# Patient Record
Sex: Male | Born: 1950 | Race: White | Hispanic: No | Marital: Married | Smoking: Former smoker
Health system: Southern US, Community
[De-identification: ages and names within clinical notes are randomized; demographics above are authoritative.]

## PROBLEM LIST (undated history)

## (undated) DIAGNOSIS — M1990 Unspecified osteoarthritis, unspecified site: Secondary | ICD-10-CM

## (undated) DIAGNOSIS — G4730 Sleep apnea, unspecified: Secondary | ICD-10-CM

## (undated) HISTORY — PX: HERNIA REPAIR: SHX51

## (undated) HISTORY — PX: FRACTURE SURGERY: SHX138

---

## 2017-01-31 ENCOUNTER — Encounter (INDEPENDENT_AMBULATORY_CARE_PROVIDER_SITE_OTHER): Payer: Self-pay | Admitting: Orthopaedic Surgery

## 2017-01-31 ENCOUNTER — Ambulatory Visit (INDEPENDENT_AMBULATORY_CARE_PROVIDER_SITE_OTHER): Payer: Medicare Other

## 2017-01-31 ENCOUNTER — Ambulatory Visit (INDEPENDENT_AMBULATORY_CARE_PROVIDER_SITE_OTHER): Payer: Medicare Other | Admitting: Orthopaedic Surgery

## 2017-01-31 DIAGNOSIS — M1612 Unilateral primary osteoarthritis, left hip: Secondary | ICD-10-CM | POA: Insufficient documentation

## 2017-01-31 DIAGNOSIS — M25552 Pain in left hip: Secondary | ICD-10-CM | POA: Diagnosis not present

## 2017-01-31 NOTE — Progress Notes (Signed)
Office Visit Note   Patient: Jay Watson           Date of Birth: 10/05/1950           MRN: [id number] Visit Date: 01/31/2017              Requested by: No referring provider defined for this encounter. PCP: Javier GlazierFarrington, Cecil, MD   Assessment & Plan: Visit Diagnoses:  1. Pain in left hip   2. Unilateral primary osteoarthritis, left hip     Plan: Given the severity of his pain and the detrimental effect of the left hip is had on his quality of life, his mobility and day-to-day activities we are recommending a hip replacement on the left hip.  His x-rays show profound loss of the joint space with collapse of the femoral head at this point.  His ligaments are also shorter on his left painful side than the right side.  I spent a considerable amount of time shown on hip model explained in detail with the surgery involves a better recommendation.  He is been hurting since 2014 now and says that it is time to proceed with this.  He is otherwise healthy individual.  We talked about the risk and benefits of surgery as well as his intraoperative and postoperative course.  We will work on getting the surgery scheduled soon.  We can see him back in 2 weeks postoperative.  All questions and concerns were answered and addressed.  Follow-Up Instructions: Return for 2 weeks post-op.   Orders:  Orders Placed This Encounter  Procedures  . XR HIP UNILAT W OR W/O PELVIS 1V LEFT   No orders of the defined types were placed in this encounter.     Procedures: No procedures performed   Clinical Data: No additional findings.   Subjective: Chief Complaint  Patient presents with  . Left Hip - Pain  Patient is a very pleasant 66 year old gentleman was referred from other friends who have done surgery on before.  He had a left hip is hurting him for over 5 years now.  In 2013 2014 started to slow down because he can play as much activity.  His pain is daily and is 10 out of 10.  Is waking up at  night and walking is become painful.  He says his left leg is now definite short and the right and the right has a history of trauma to it.  His pain is in the groin on the left side.  He is tried and failed all forms of conservative treatment including rest, ice, heat, anti-inflammatories, quad strengthening exercises, nutrition and even a walking assistive device.  Again he sought all this treatment out for well over 5 years now.  HPI  Review of Systems He currently denies any headache, chest pain, shortness of breath, fever, chills, nausea, vomiting.  Objective: Vital Signs: There were no vitals taken for this visit.  Physical Exam Is alert and oriented x3 and in no acute distress Ortho Exam Examination of his left hip shows significant limitations with internal and external rotation severe pain in the groin with attempts at rotation.  He is actually shorter on the left side than the right as well. Specialty Comments:  No specialty comments available.  Imaging: Xr Hip Unilat W Or W/o Pelvis 1v Left  Result Date: 01/31/2017 An AP pelvis and a lateral of the left hip show severe end-stage arthritis of the left hip.  There is complete loss of superolateral  joint space.  There is slight femoral head collapse.  There is severe sclerotic changes and cystic changes.    PMFS History: Patient Active Problem List   Diagnosis Date Noted  . Unilateral primary osteoarthritis, left hip 01/31/2017  . Pain in left hip 01/31/2017   History reviewed. No pertinent past medical history.  History reviewed. No pertinent family history.  History reviewed. No pertinent surgical history. Social History   Occupational History  . Not on file  Tobacco Use  . Smoking status: Not on file  Substance and Sexual Activity  . Alcohol use: Not on file  . Drug use: Not on file  . Sexual activity: Not on file

## 2017-02-28 ENCOUNTER — Other Ambulatory Visit (INDEPENDENT_AMBULATORY_CARE_PROVIDER_SITE_OTHER): Payer: Self-pay | Admitting: Physician Assistant

## 2017-02-28 NOTE — Pre-Procedure Instructions (Signed)
Jay Watson  02/28/2017      [id number] - [place], [place] - [address] [place] [address] Phone: [phone] Fax: [phone]    Your procedure is scheduled on December 18th, Tuesday   [facility] Ad[facility] at 10:30 AM             (posted surgery time 12:30p - 2:15p)   Call this number if you have problems the [facility]:  [phone].   Remember:               4-5 days prior to surgery, STOP TAKING any Vitamins, Herbal Supplements, Anti-inflammatories.   Do not eat food or drink liquids after midnight Monday.   Take these medicines the morning of surgery with A SIP OF WATER : NONE   Do not wear jewelry - no rings or watches.  Do not wear lotions, colognes or deoderant.             Men may shave face and neck.   Do not bring valuables to the hospital.  [facility] is not responsible for any belongings or valuables.  Contacts, dentures or bridgework may not be worn into surgery.  Leave your suitcase in the car.  After surgery it may be brought to your room.  For patients admitted to the hospital, discharge time will be determined by your treatment team.  Please read over the following fact sheets that you were given. Pain Booklet, MRSA Information and Surgical Site Infection Prevention      [place]- Preparing For Surgery  Before surgery, you can play an important role. Because skin is not sterile, your skin needs to be as free of germs as possible. You can reduce the number of germs on your skin by washing with CHG (chlorahexidine gluconate) Soap before surgery.  CHG is an antiseptic cleaner which kills germs and bonds with the skin to continue killing germs even after washing.  Please do not use if you have an allergy to CHG or antibacterial soaps. If your skin becomes reddened/irritated stop using the CHG.  Do not shave (including legs and underarms) for at least 48 hours prior to first CHG shower.  It is OK to shave your face.  Please follow these instructions carefully.   1. Shower the NIGHT BEFORE SURGERY and the MORNING OF SURGERY with CHG.   2. If you chose to wash your hair, wash your hair first as usual with your normal shampoo.  3. After you shampoo, rinse your hair and body thoroughly to remove the shampoo.  4. Use CHG as you would any other liquid soap. You can apply CHG directly to the skin and wash gently with a scrungie or a clean washcloth.   5. Apply the CHG Soap to your body ONLY FROM THE NECK DOWN.  Do not use on open wounds or open sores. Avoid contact with your eyes, ears, mouth and genitals (private parts). Wash Face and genitals (private parts)  with your normal soap.  6. Wash thoroughly, paying special attention to the area where your surgery will be performed.  7. Thoroughly rinse your body with warm water from the neck down.  8. DO NOT shower/wash with your normal soap after using and rinsing off the CHG Soap.  9. Pat yourself dry with a CLEAN TOWEL.  10. Wear CLEAN PAJAMAS to bed the night before surgery, wear comfortable clothes the morning of surgery  11. Place  CLEAN SHEETS on your bed the night of your first shower and DO NOT SLEEP WITH PETS.    Day of Surgery: Do not apply any deodorants/lotions. Please wear clean clothes to the hospital/surgery center.

## 2017-02-28 NOTE — Pre-Procedure Instructions (Signed)
Jay Watson  02/28/2017      [id number] - [place], [place] - [address] [place] [address] Phone: [phone] Fax: [phone]    Your procedure is scheduled on Tuesday 03/13/2017.  [facility] at 1030 A.M.  Call this number if you have problems the morning of surgery:  [phone]   Remember:  Do not eat food or drink liquids after midnight.  Take these medicines the morning of surgery with A SIP OF WATER: NONE  7 days prior to surgery STOP taking any Aspirin(unless otherwise instructed by your surgeon), Aleve, Naproxen, Ibuprofen, Motrin, Advil, Goody's, BC's, all herbal medications, fish oil, and all vitamins   Do not wear jewelry.  Do not wear lotions, powders, or colognes, or deodorant.  Men may shave face and neck.  Do not bring valuables to the hospital.  [facility] is not responsible for any belongings or valuables.  Contacts, eyeglasses, dentures or bridgework may not be worn into surgery.  Leave your suitcase in the car.  After surgery it may be brought to your room.  For patients admitted to the hospital, discharge time will be determined by your treatment team.  Patients discharged the day of surgery will not be allowed to drive home.   Name and phone number of your driver:    Special instructions:   [place]- Preparing For Surgery  Before surgery, you can play an important role. Because skin is not sterile, your skin needs to be as free of germs as possible. You can reduce the number of germs on your skin by washing with CHG (chlorahexidine gluconate) Soap before surgery.  CHG is an antiseptic cleaner which kills germs and bonds with the skin to continue killing germs even after washing.  Please do not use if you have an allergy to CHG or antibacterial soaps. If your skin becomes reddened/irritated stop using the CHG.  Do not shave (including legs and underarms) for at least 48 hours prior to  first CHG shower. It is OK to shave your face.  Please follow these instructions carefully.   1. Shower the NIGHT BEFORE SURGERY and the MORNING OF SURGERY with CHG.   2. If you chose to wash your hair, wash your hair first as usual with your normal shampoo.  3. After you shampoo, rinse your hair and body thoroughly to remove the shampoo.  4. Use CHG as you would any other liquid soap. You can apply CHG directly to the skin and wash gently with a scrungie or a clean washcloth.   5. Apply the CHG Soap to your body ONLY FROM THE NECK DOWN.  Do not use on open wounds or open sores. Avoid contact with your eyes, ears, mouth and genitals (private parts). Wash Face and genitals (private parts)  with your normal soap.  6. Wash thoroughly, paying special attention to the area where your surgery will be performed.  7. Thoroughly rinse your body with warm water from the neck down.  8. DO NOT shower/wash with your normal soap after using and rinsing off the CHG Soap.  9. Pat yourself dry with a CLEAN TOWEL.  10. Wear CLEAN PAJAMAS to bed the night before surgery, wear comfortable clothes the morning of surgery  11. Place CLEAN SHEETS on your bed the night of your first shower and DO NOT SLEEP WITH PETS.    Day of Surgery: Shower as stated above. Do not apply any  deodorants/lotions.  Please wear clean clothes to the hospital/surgery center.      Please read over the following fact sheets that you were given.

## 2017-03-01 ENCOUNTER — Other Ambulatory Visit: Payer: Self-pay

## 2017-03-01 ENCOUNTER — Encounter (HOSPITAL_COMMUNITY)
Admission: RE | Admit: 2017-03-01 | Discharge: 2017-03-01 | Disposition: A | Discharge status: Home / Self Care | Payer: Medicare Other | Source: Ambulatory Visit | Attending: Orthopaedic Surgery | Admitting: Orthopaedic Surgery

## 2017-03-01 ENCOUNTER — Encounter (HOSPITAL_COMMUNITY): Payer: Self-pay

## 2017-03-01 DIAGNOSIS — Z01818 Encounter for other preprocedural examination: Secondary | ICD-10-CM | POA: Insufficient documentation

## 2017-03-01 DIAGNOSIS — M1612 Unilateral primary osteoarthritis, left hip: Secondary | ICD-10-CM | POA: Diagnosis not present

## 2017-03-01 HISTORY — DX: Unspecified osteoarthritis, unspecified site: M19.90

## 2017-03-01 HISTORY — DX: Sleep apnea, unspecified: G47.30

## 2017-03-01 LAB — BASIC METABOLIC PANEL
Anion gap: 9 (ref 5–15)
BUN: 20 mg/dL (ref 6–20)
CHLORIDE: 105 mmol/L (ref 101–111)
CO2: 24 mmol/L (ref 22–32)
CREATININE: 0.97 mg/dL (ref 0.61–1.24)
Calcium: 9.4 mg/dL (ref 8.9–10.3)
GFR calc Af Amer: >60 mL/min (ref >60)
GLUCOSE: 103 mg/dL — AB (ref 65–99)
POTASSIUM: 4.5 mmol/L (ref 3.5–5.1)
Sodium: 138 mmol/L (ref 135–145)

## 2017-03-01 LAB — CBC
HCT: 44.9 % (ref 39.0–52.0)
Hemoglobin: 15 g/dL (ref 13.0–17.0)
MCH: 29.4 pg (ref 26.0–34.0)
MCHC: 33.4 g/dL (ref 30.0–36.0)
MCV: 88 fL (ref 78.0–100.0)
PLATELETS: 217 10*3/uL (ref 150–400)
RBC: 5.1 MIL/uL (ref 4.22–5.81)
RDW: 12.6 % (ref 11.5–15.5)
WBC: 9 10*3/uL (ref 4.0–10.5)

## 2017-03-01 LAB — SURGICAL PCR SCREEN
MRSA, PCR: NEGATIVE
Staphylococcus aureus: NEGATIVE

## 2017-03-01 NOTE — Progress Notes (Signed)
PCP [place] Dr. [place].  LOV 01/2016  He is aware of upcoming surgery. [place]  Wears the mask periodically.  Setting is 4. Denies murmur, cp, sob.   Denies ever having cardiac tests or seeing a cardiologist.

## 2017-03-12 MED ORDER — CLINDAMYCIN PHOSPHATE 900 MG/50ML IV SOLN
900.0000 mg | INTRAVENOUS | Status: AC
  Administered 2017-03-13: 900 mg via INTRAVENOUS
  Filled 2017-03-12: qty 50

## 2017-03-12 MED ORDER — SODIUM CHLORIDE 0.9 % IV SOLN
1000.0000 mg | INTRAVENOUS | Status: AC
  Administered 2017-03-13: 1000 mg via INTRAVENOUS
  Filled 2017-03-12: qty 1100

## 2017-03-13 ENCOUNTER — Inpatient Hospital Stay (HOSPITAL_COMMUNITY): Payer: Medicare Other

## 2017-03-13 ENCOUNTER — Encounter (HOSPITAL_COMMUNITY): Payer: Self-pay

## 2017-03-13 ENCOUNTER — Encounter (HOSPITAL_COMMUNITY)
Admission: RE | Disposition: A | Discharge status: Home / Self Care | Payer: Self-pay | Source: Ambulatory Visit | Attending: Orthopaedic Surgery

## 2017-03-13 ENCOUNTER — Inpatient Hospital Stay (HOSPITAL_COMMUNITY): Payer: Medicare Other | Admitting: Anesthesiology

## 2017-03-13 ENCOUNTER — Inpatient Hospital Stay (HOSPITAL_COMMUNITY)
Admission: RE | Admit: 2017-03-13 | Discharge: 2017-03-15 | DRG: 470 | Disposition: A | Discharge status: Home / Self Care | Payer: Medicare Other | Source: Ambulatory Visit | Attending: Orthopaedic Surgery | Admitting: Orthopaedic Surgery

## 2017-03-13 DIAGNOSIS — Z96642 Presence of left artificial hip joint: Secondary | ICD-10-CM

## 2017-03-13 DIAGNOSIS — Z880 Allergy status to penicillin: Secondary | ICD-10-CM

## 2017-03-13 DIAGNOSIS — G4730 Sleep apnea, unspecified: Secondary | ICD-10-CM | POA: Diagnosis present

## 2017-03-13 DIAGNOSIS — Z419 Encounter for procedure for purposes other than remedying health state, unspecified: Secondary | ICD-10-CM

## 2017-03-13 DIAGNOSIS — M1612 Unilateral primary osteoarthritis, left hip: Secondary | ICD-10-CM | POA: Diagnosis present

## 2017-03-13 HISTORY — PX: TOTAL HIP ARTHROPLASTY: SHX124

## 2017-03-13 SURGERY — ARTHROPLASTY, HIP, TOTAL, ANTERIOR APPROACH
Anesthesia: General | Laterality: Left

## 2017-03-13 MED ORDER — ACETAMINOPHEN 650 MG RE SUPP: 650.0000 mg | RECTAL | Status: DC | PRN

## 2017-03-13 MED ORDER — HYDROMORPHONE HCL 1 MG/ML IJ SOLN
1.0000 mg | INTRAMUSCULAR | Status: DC | PRN
  Administered 2017-03-14: 1 mg via INTRAVENOUS
  Filled 2017-03-13: qty 1

## 2017-03-13 MED ORDER — ACETAMINOPHEN 325 MG PO TABS: 650.0000 mg | ORAL_TABLET | ORAL | Status: DC | PRN

## 2017-03-13 MED ORDER — DEXAMETHASONE SODIUM PHOSPHATE 10 MG/ML IJ SOLN
INTRAMUSCULAR | Status: AC
  Filled 2017-03-13: qty 1

## 2017-03-13 MED ORDER — DOCUSATE SODIUM 100 MG PO CAPS
100.0000 mg | ORAL_CAPSULE | Freq: Two times a day (BID) | ORAL | Status: DC
  Administered 2017-03-13 – 2017-03-15 (×4): 100 mg via ORAL
  Filled 2017-03-13 (×4): qty 1

## 2017-03-13 MED ORDER — FENTANYL CITRATE (PF) 100 MCG/2ML IJ SOLN
INTRAMUSCULAR | Status: DC | PRN
  Administered 2017-03-13: 50 ug via INTRAVENOUS
  Administered 2017-03-13: 125 ug via INTRAVENOUS
  Administered 2017-03-13: 50 ug via INTRAVENOUS
  Administered 2017-03-13: 125 ug via INTRAVENOUS
  Administered 2017-03-13: 50 ug via INTRAVENOUS

## 2017-03-13 MED ORDER — METHOCARBAMOL 500 MG PO TABS
ORAL_TABLET | ORAL | Status: AC
  Administered 2017-03-13: 500 mg via ORAL
  Filled 2017-03-13: qty 1

## 2017-03-13 MED ORDER — DIPHENHYDRAMINE HCL 12.5 MG/5ML PO ELIX: 12.5000 mg | ORAL_SOLUTION | ORAL | Status: DC | PRN

## 2017-03-13 MED ORDER — MENTHOL 3 MG MT LOZG: 1.0000 | LOZENGE | OROMUCOSAL | Status: DC | PRN

## 2017-03-13 MED ORDER — ROCURONIUM BROMIDE 100 MG/10ML IV SOLN
INTRAVENOUS | Status: DC | PRN
  Administered 2017-03-13: 60 mg via INTRAVENOUS
  Administered 2017-03-13: 10 mg via INTRAVENOUS

## 2017-03-13 MED ORDER — PHENYLEPHRINE HCL 10 MG/ML IJ SOLN
INTRAVENOUS | Status: DC | PRN
  Administered 2017-03-13: 25 ug/min via INTRAVENOUS

## 2017-03-13 MED ORDER — SUGAMMADEX SODIUM 200 MG/2ML IV SOLN
INTRAVENOUS | Status: DC | PRN
  Administered 2017-03-13: 200 mg via INTRAVENOUS

## 2017-03-13 MED ORDER — ONDANSETRON HCL 4 MG PO TABS: 4.0000 mg | ORAL_TABLET | Freq: Four times a day (QID) | ORAL | Status: DC | PRN

## 2017-03-13 MED ORDER — ASPIRIN EC 325 MG PO TBEC
325.0000 mg | DELAYED_RELEASE_TABLET | Freq: Every day | ORAL | Status: DC
  Administered 2017-03-14 – 2017-03-15 (×2): 325 mg via ORAL
  Filled 2017-03-13 (×2): qty 1

## 2017-03-13 MED ORDER — HYDROMORPHONE HCL 1 MG/ML IJ SOLN
0.2500 mg | INTRAMUSCULAR | Status: DC | PRN
  Administered 2017-03-13 (×4): 0.5 mg via INTRAVENOUS

## 2017-03-13 MED ORDER — ONDANSETRON HCL 4 MG/2ML IJ SOLN
INTRAMUSCULAR | Status: DC | PRN
  Administered 2017-03-13: 4 mg via INTRAVENOUS

## 2017-03-13 MED ORDER — FENTANYL CITRATE (PF) 250 MCG/5ML IJ SOLN
INTRAMUSCULAR | Status: AC
  Filled 2017-03-13: qty 5

## 2017-03-13 MED ORDER — 0.9 % SODIUM CHLORIDE (POUR BTL) OPTIME
TOPICAL | Status: DC | PRN
  Administered 2017-03-13: 1000 mL

## 2017-03-13 MED ORDER — DEXAMETHASONE SODIUM PHOSPHATE 10 MG/ML IJ SOLN
INTRAMUSCULAR | Status: DC | PRN
  Administered 2017-03-13: 10 mg via INTRAVENOUS

## 2017-03-13 MED ORDER — SODIUM CHLORIDE 0.9 % IV SOLN
INTRAVENOUS | Status: DC
  Administered 2017-03-13 (×2): via INTRAVENOUS

## 2017-03-13 MED ORDER — LACTATED RINGERS IV SOLN
INTRAVENOUS | Status: DC
  Administered 2017-03-13 (×2): via INTRAVENOUS

## 2017-03-13 MED ORDER — PHENOL 1.4 % MT LIQD: 1.0000 | OROMUCOSAL | Status: DC | PRN

## 2017-03-13 MED ORDER — METOCLOPRAMIDE HCL 5 MG/ML IJ SOLN: 5.0000 mg | Freq: Three times a day (TID) | INTRAMUSCULAR | Status: DC | PRN

## 2017-03-13 MED ORDER — LIDOCAINE HCL (CARDIAC) 20 MG/ML IV SOLN
INTRAVENOUS | Status: DC | PRN
  Administered 2017-03-13: 100 mg via INTRAVENOUS

## 2017-03-13 MED ORDER — METHOCARBAMOL 500 MG PO TABS
500.0000 mg | ORAL_TABLET | Freq: Four times a day (QID) | ORAL | Status: DC | PRN
  Administered 2017-03-13 – 2017-03-15 (×3): 500 mg via ORAL
  Filled 2017-03-13 (×2): qty 1

## 2017-03-13 MED ORDER — PROPOFOL 10 MG/ML IV BOLUS
INTRAVENOUS | Status: AC
  Filled 2017-03-13: qty 20

## 2017-03-13 MED ORDER — CHLORHEXIDINE GLUCONATE 4 % EX LIQD: 60.0000 mL | Freq: Once | CUTANEOUS | Status: DC

## 2017-03-13 MED ORDER — SUCCINYLCHOLINE CHLORIDE 200 MG/10ML IV SOSY
PREFILLED_SYRINGE | INTRAVENOUS | Status: AC
  Filled 2017-03-13: qty 10

## 2017-03-13 MED ORDER — POLYETHYLENE GLYCOL 3350 17 G PO PACK: 17.0000 g | PACK | Freq: Every day | ORAL | Status: DC | PRN

## 2017-03-13 MED ORDER — METOCLOPRAMIDE HCL 5 MG PO TABS: 5.0000 mg | ORAL_TABLET | Freq: Three times a day (TID) | ORAL | Status: DC | PRN

## 2017-03-13 MED ORDER — HYDROMORPHONE HCL 1 MG/ML IJ SOLN
INTRAMUSCULAR | Status: AC
  Administered 2017-03-13: 0.5 mg via INTRAVENOUS
  Filled 2017-03-13: qty 1

## 2017-03-13 MED ORDER — PROPOFOL 10 MG/ML IV BOLUS
INTRAVENOUS | Status: DC | PRN
  Administered 2017-03-13: 200 mg via INTRAVENOUS

## 2017-03-13 MED ORDER — CLINDAMYCIN PHOSPHATE 600 MG/50ML IV SOLN
600.0000 mg | Freq: Four times a day (QID) | INTRAVENOUS | Status: AC
  Administered 2017-03-13 – 2017-03-14 (×2): 600 mg via INTRAVENOUS
  Filled 2017-03-13 (×2): qty 50

## 2017-03-13 MED ORDER — OXYCODONE HCL 5 MG PO TABS
10.0000 mg | ORAL_TABLET | ORAL | Status: DC | PRN
  Administered 2017-03-13 – 2017-03-15 (×8): 10 mg via ORAL
  Filled 2017-03-13 (×7): qty 2

## 2017-03-13 MED ORDER — OXYCODONE HCL 5 MG PO TABS
ORAL_TABLET | ORAL | Status: AC
  Filled 2017-03-13: qty 2

## 2017-03-13 MED ORDER — HYDROCODONE-ACETAMINOPHEN 5-325 MG PO TABS
1.0000 | ORAL_TABLET | ORAL | Status: DC | PRN
  Administered 2017-03-13 – 2017-03-14 (×3): 2 via ORAL
  Filled 2017-03-13 (×3): qty 2

## 2017-03-13 MED ORDER — ALUM & MAG HYDROXIDE-SIMETH 200-200-20 MG/5ML PO SUSP: 30.0000 mL | ORAL | Status: DC | PRN

## 2017-03-13 MED ORDER — METHOCARBAMOL 1000 MG/10ML IJ SOLN
500.0000 mg | Freq: Four times a day (QID) | INTRAVENOUS | Status: DC | PRN
  Filled 2017-03-13: qty 5

## 2017-03-13 MED ORDER — OXYCODONE HCL 5 MG PO TABS: 5.0000 mg | ORAL_TABLET | Freq: Once | ORAL | Status: DC | PRN

## 2017-03-13 MED ORDER — LIDOCAINE 2% (20 MG/ML) 5 ML SYRINGE
INTRAMUSCULAR | Status: AC
  Filled 2017-03-13: qty 5

## 2017-03-13 MED ORDER — ONDANSETRON HCL 4 MG/2ML IJ SOLN: 4.0000 mg | Freq: Four times a day (QID) | INTRAMUSCULAR | Status: DC | PRN

## 2017-03-13 MED ORDER — GABAPENTIN 100 MG PO CAPS
100.0000 mg | ORAL_CAPSULE | Freq: Three times a day (TID) | ORAL | Status: DC
  Administered 2017-03-13 – 2017-03-15 (×5): 100 mg via ORAL
  Filled 2017-03-13 (×5): qty 1

## 2017-03-13 MED ORDER — OXYCODONE HCL 5 MG/5ML PO SOLN: 5.0000 mg | Freq: Once | ORAL | Status: DC | PRN

## 2017-03-13 MED ORDER — ONDANSETRON HCL 4 MG/2ML IJ SOLN
INTRAMUSCULAR | Status: AC
  Filled 2017-03-13: qty 2

## 2017-03-13 SURGICAL SUPPLY — 54 items
BENZOIN TINCTURE PRP APPL 2/3 (GAUZE/BANDAGES/DRESSINGS) ×3 IMPLANT
BLADE CLIPPER SURG (BLADE) IMPLANT
BLADE SAW SGTL 18X1.27X75 (BLADE) ×2 IMPLANT
BLADE SAW SGTL 18X1.27X75MM (BLADE) ×1
CAPT HIP TOTAL 2 ×3 IMPLANT
CELLS DAT CNTRL 66122 CELL SVR (MISCELLANEOUS) ×1 IMPLANT
CLOSURE WOUND 1/2 X4 (GAUZE/BANDAGES/DRESSINGS) ×1
COVER SURGICAL LIGHT HANDLE (MISCELLANEOUS) ×3 IMPLANT
DRAPE C-ARM 42X72 X-RAY (DRAPES) ×3 IMPLANT
DRAPE STERI IOBAN 125X83 (DRAPES) ×3 IMPLANT
DRAPE U-SHAPE 47X51 STRL (DRAPES) ×9 IMPLANT
DRESSING AQUACEL AG SP 3.5X10 (GAUZE/BANDAGES/DRESSINGS) ×1 IMPLANT
DRSG AQUACEL AG ADV 3.5X10 (GAUZE/BANDAGES/DRESSINGS) IMPLANT
DRSG AQUACEL AG SP 3.5X10 (GAUZE/BANDAGES/DRESSINGS) ×3
DURAPREP 26ML APPLICATOR (WOUND CARE) ×3 IMPLANT
ELECT BLADE 4.0 EZ CLEAN MEGAD (MISCELLANEOUS) ×3
ELECT BLADE 6.5 EXT (BLADE) IMPLANT
ELECT REM PT RETURN 9FT ADLT (ELECTROSURGICAL) ×3
ELECTRODE BLDE 4.0 EZ CLN MEGD (MISCELLANEOUS) ×1 IMPLANT
ELECTRODE REM PT RTRN 9FT ADLT (ELECTROSURGICAL) ×1 IMPLANT
FACESHIELD WRAPAROUND (MASK) ×6 IMPLANT
GLOVE BIO SURGEON STRL SZ 6.5 (GLOVE) ×2 IMPLANT
GLOVE BIO SURGEONS STRL SZ 6.5 (GLOVE) ×1
GLOVE BIOGEL PI IND STRL 6.5 (GLOVE) ×1 IMPLANT
GLOVE BIOGEL PI IND STRL 8 (GLOVE) ×2 IMPLANT
GLOVE BIOGEL PI INDICATOR 6.5 (GLOVE) ×2
GLOVE BIOGEL PI INDICATOR 8 (GLOVE) ×4
GLOVE ECLIPSE 8.0 STRL XLNG CF (GLOVE) ×3 IMPLANT
GLOVE ORTHO TXT STRL SZ7.5 (GLOVE) ×6 IMPLANT
GOWN STRL REUS W/ TWL LRG LVL3 (GOWN DISPOSABLE) ×2 IMPLANT
GOWN STRL REUS W/ TWL XL LVL3 (GOWN DISPOSABLE) ×2 IMPLANT
GOWN STRL REUS W/TWL LRG LVL3 (GOWN DISPOSABLE) ×4
GOWN STRL REUS W/TWL XL LVL3 (GOWN DISPOSABLE) ×4
HANDPIECE INTERPULSE COAX TIP (DISPOSABLE) ×2
KIT BASIN OR (CUSTOM PROCEDURE TRAY) ×3 IMPLANT
KIT ROOM TURNOVER OR (KITS) ×3 IMPLANT
MANIFOLD NEPTUNE II (INSTRUMENTS) ×3 IMPLANT
NS IRRIG 1000ML POUR BTL (IV SOLUTION) ×3 IMPLANT
PACK TOTAL JOINT (CUSTOM PROCEDURE TRAY) ×3 IMPLANT
PAD ARMBOARD 7.5X6 YLW CONV (MISCELLANEOUS) ×3 IMPLANT
RTRCTR WOUND ALEXIS 18CM MED (MISCELLANEOUS) ×3
SET HNDPC FAN SPRY TIP SCT (DISPOSABLE) ×1 IMPLANT
STRIP CLOSURE SKIN 1/2X4 (GAUZE/BANDAGES/DRESSINGS) ×2 IMPLANT
SUT ETHIBOND NAB CT1 #1 30IN (SUTURE) ×3 IMPLANT
SUT MNCRL AB 4-0 PS2 18 (SUTURE) IMPLANT
SUT VIC AB 0 CT1 27 (SUTURE) ×2
SUT VIC AB 0 CT1 27XBRD ANBCTR (SUTURE) ×1 IMPLANT
SUT VIC AB 1 CT1 27 (SUTURE) ×2
SUT VIC AB 1 CT1 27XBRD ANBCTR (SUTURE) ×1 IMPLANT
SUT VIC AB 2-0 CT1 27 (SUTURE) ×2
SUT VIC AB 2-0 CT1 TAPERPNT 27 (SUTURE) ×1 IMPLANT
TOWEL OR 17X26 10 PK STRL BLUE (TOWEL DISPOSABLE) ×3 IMPLANT
TRAY FOLEY W/METER SILVER 16FR (SET/KITS/TRAYS/PACK) IMPLANT
WATER STERILE IRR 1000ML POUR (IV SOLUTION) IMPLANT

## 2017-03-13 NOTE — Anesthesia Preprocedure Evaluation (Signed)
Anesthesia Evaluation  Patient identified by MRN, date of birth, ID band Patient awake    Reviewed: Allergy & Precautions, NPO status , Patient's Chart, lab work & pertinent test results  Airway Mallampati: II  TM Distance: >3 FB Neck ROM: Full    Dental  (+) Teeth Intact, Dental Advisory Given   Pulmonary sleep apnea , former smoker,    breath sounds clear to auscultation       Cardiovascular negative cardio ROS   Rhythm:Regular Rate:Normal     Neuro/Psych    GI/Hepatic negative GI ROS, Neg liver ROS,   Endo/Other  negative endocrine ROS  Renal/GU negative Renal ROS     Musculoskeletal  (+) Arthritis ,   Abdominal (+) + obese,   Peds  Hematology   Anesthesia Other Findings   Reproductive/Obstetrics                             Anesthesia Physical Anesthesia Plan  ASA: II  Anesthesia Plan: General   Post-op Pain Management:    Induction: Intravenous  PONV Risk Score and Plan: 4 or greater and Ondansetron, Dexamethasone and Treatment may vary due to age or medical condition  Airway Management Planned: Oral ETT  Additional Equipment:   Intra-op Plan:   Post-operative Plan: Extubation in OR  Informed Consent: I have reviewed the patients History and Physical, chart, labs and discussed the procedure including the risks, benefits and alternatives for the proposed anesthesia with the patient or authorized representative who has indicated his/her understanding and acceptance.     Plan Discussed with: CRNA  Anesthesia Plan Comments:         Anesthesia Quick Evaluation

## 2017-03-13 NOTE — Anesthesia Procedure Notes (Signed)
Procedure Name: Intubation Date/Time: 03/13/2017 1:15 PM Performed by: Coralee RudFlores, Emberli Ballester, CRNA Pre-anesthesia Checklist: Patient identified, Emergency Drugs available, Suction available and Patient being monitored Patient Re-evaluated:Patient Re-evaluated prior to induction Oxygen Delivery Method: Circle system utilized Preoxygenation: Pre-oxygenation with 100% oxygen Induction Type: IV induction Ventilation: Mask ventilation without difficulty and Oral airway inserted - appropriate to patient size Laryngoscope Size: Miller and 3 Grade View: Grade II Tube type: Oral Tube size: 7.5 mm Number of attempts: 1 Placement Confirmation: ETT inserted through vocal cords under direct vision,  positive ETCO2 and breath sounds checked- equal and bilateral Secured at: 22 cm Tube secured with: Tape Dental Injury: Teeth and Oropharynx as per pre-operative assessment

## 2017-03-13 NOTE — Anesthesia Postprocedure Evaluation (Signed)
Anesthesia Post Note  Patient: [name]  Procedure(s) Performed: LEFT TOTAL HIP ARTHROPLASTY ANTERIOR APPROACH (Left )     Patient location during evaluation: Nursing Unit Anesthesia Type: General Level of consciousness: awake, awake and alert and oriented Pain management: pain level controlled Vital Signs Assessment: post-procedure vital signs reviewed and stable Respiratory status: spontaneous breathing, nonlabored ventilation and respiratory function stable Cardiovascular status: blood pressure returned to baseline Anesthetic complications: no    Last Vitals:  Vitals:   03/13/17 1622 03/13/17 1654  BP: 138/84 (!) 145/95  Pulse: 62 69  Resp: (!) 9   Temp:  36.5 C  SpO2: 99% 99%    Last Pain:  Vitals:   03/13/17 1654  TempSrc: Oral  PainSc:                  Jay Watson

## 2017-03-13 NOTE — Evaluation (Signed)
Physical Therapy Evaluation Patient Details Name: Jay Watson Depuy MRN: [id number] DOB: Nov 06, 1950 Today's Date: 03/13/2017   History of Present Illness  Pt is a 66 y/o male s/p L THA. PMH includes OSA, and R femur fracture surgery.   Clinical Impression  Pt is s/p surgery above with deficits below. PTA, pt was independent with functional mobility. Upon eval, pt presenting with post op pain and weakness, decreased balance, and anxiety with movement. Limited mobility secondary to pain and anxiety and only able to perform stand pivot to chair. Required standing rest and cues for pursed lip breathing to calm anxiety. Required min to min guard assist with use of RW to perform stand pivot to char. Reports wife will be able to assist at d/c and will need DME below. Per pt, will be receiving HHPT at d/c to address mobility deficits. Will continue to follow acutely to maximize functional mobility independence and safety.     Follow Up Recommendations DC plan and follow up therapy as arranged by surgeon;Home health PT;Supervision for mobility/OOB    Equipment Recommendations  Rolling walker with 5" wheels;3in1 (PT)    Recommendations for Other Services       Precautions / Restrictions Precautions Precautions: None Precaution Comments: Reviewed supine ther ex with pt and THA handout.  Restrictions Weight Bearing Restrictions: Yes LLE Weight Bearing: Weight bearing as tolerated      Mobility  Bed Mobility Overal bed mobility: Needs Assistance Bed Mobility: Supine to Sit     Supine to sit: Min assist     General bed mobility comments: Min A for LLE management and trunk elevation. Increased time required secondary to pain.   Transfers Overall transfer level: Needs assistance Equipment used: Rolling walker (2 wheeled) Transfers: Sit to/from [employer]Stand;Stand Pivot Transfers Sit to Stand: Min assist Stand pivot transfers: Min guard       General transfer comment: Min A for lift assist and  steadying assist. Verbal cues for safe hand placement. Pt very anxious upon standing and required standing rest and cues for pursed lip breathing to calm anxiety. Verbal cues for sequencing to perform stand pivot to chair. Required min guard assist for steadying. Further mobility limited secondary to pain.   Ambulation/Gait             General Gait Details: Deferred secondary to increased pain and anxiety.   Stairs            Wheelchair Mobility    Modified Rankin (Stroke Patients Only)       Balance Overall balance assessment: Needs assistance Sitting-balance support: No upper extremity supported;Feet supported Sitting balance-Leahy Scale: Good     Standing balance support: Bilateral upper extremity supported;During functional activity Standing balance-Leahy Scale: Poor Standing balance comment: Reliant on BUE support                              Pertinent Vitals/Pain Pain Assessment: 0-10 Pain Score: 8  Pain Location: L hip  Pain Descriptors / Indicators: Aching;Operative site guarding Pain Intervention(s): Limited activity within patient's tolerance;Monitored during session;Repositioned;Premedicated before session    Home Living Family/patient expects to be discharged to:: Private residence Living Arrangements: Spouse/significant other Available Help at Discharge: Family;Available 24 hours/day Type of Home: House Home Access: Stairs to enter Entrance Stairs-Rails: Right Entrance Stairs-Number of Steps: 3 Home Layout: One level Home Equipment: None      Prior Function Level of Independence: Independent  Hand Dominance        Extremity/Trunk Assessment   Upper Extremity Assessment Upper Extremity Assessment: Defer to OT evaluation    Lower Extremity Assessment Lower Extremity Assessment: LLE deficits/detail LLE Deficits / Details: Sensory in tact. Deficits consistent with post op pain and weakness. Able to perform  ther ex below.     Cervical / Trunk Assessment Cervical / Trunk Assessment: Normal  Communication   Communication: No difficulties  Cognition Arousal/Alertness: Awake/alert Behavior During Therapy: WFL for tasks assessed/performed Overall Cognitive Status: Within Functional Limits for tasks assessed                                        General Comments General comments (skin integrity, edema, etc.): Pt's wife present during session     Exercises Total Joint Exercises Ankle Circles/Pumps: AROM;Both;20 reps Quad Sets: AROM;Left;10 reps Heel Slides: AAROM;Left;10 reps(partial range )   Assessment/Plan    PT Assessment Patient needs continued PT services  PT Problem List Decreased strength;Decreased activity tolerance;Decreased range of motion;Decreased balance;Decreased mobility;Decreased knowledge of use of DME;Decreased knowledge of precautions;Pain       PT Treatment Interventions DME instruction;Gait training;Stair training;Functional mobility training;Therapeutic exercise;Therapeutic activities;Balance training;Neuromuscular re-education;Patient/family education    PT Goals (Current goals can be found in the Care Plan section)  Acute Rehab PT Goals Patient Stated Goal: to decrease pain  PT Goal Formulation: With patient Time For Goal Achievement: 03/20/17 Potential to Achieve Goals: Good    Frequency 7X/week   Barriers to discharge        Co-evaluation               AM-PAC PT "6 Clicks" Daily Activity  Outcome Measure Difficulty turning over in bed (including adjusting bedclothes, sheets and blankets)?: A Little Difficulty moving from lying on back to sitting on the side of the bed? : Unable Difficulty sitting down on and standing up from a chair with arms (e.g., wheelchair, bedside commode, etc,.)?: Unable Help needed moving to and from a bed to chair (including a wheelchair)?: A Little Help needed walking in hospital room?: A  Little Help needed climbing 3-5 steps with a railing? : A Lot 6 Click Score: 13    End of Session Equipment Utilized During Treatment: Gait belt Activity Tolerance: Patient limited by pain Patient left: in chair;with call bell/phone within reach;with family/visitor present Nurse Communication: Mobility status PT Visit Diagnosis: Other abnormalities of gait and mobility (R26.89);Pain Pain - Right/Left: Left Pain - part of body: Hip    Time: [id number] PT Time Calculation (min) (ACUTE ONLY): 24 min   Charges:   PT Evaluation $PT Eval Low Complexity: 1 Low PT Treatments $Therapeutic Activity: 8-22 mins   PT G Codes:        Gladys DammeBrittany Jenisis Harmsen, PT, DPT  Acute Rehabilitation Services  Pager: [phone]   Lehman PromBrittany S Charlyne Robertshaw 03/13/2017, 6:25 PM

## 2017-03-13 NOTE — H&P (Signed)
TOTAL HIP ADMISSION H&P  Patient is admitted for left total hip arthroplasty.  Subjective:  Chief Complaint: left hip pain  HPI: Jay Watson, 66 y.o. male, has a history of pain and functional disability in the left hip(s) due to arthritis and patient has failed non-surgical conservative treatments for greater than 12 weeks to include NSAID's and/or analgesics, corticosteriod injections, viscosupplementation injections, flexibility and strengthening excercises, use of assistive devices, weight reduction as appropriate and activity modification.  Onset of symptoms was gradual starting 3 years ago with gradually worsening course since that time.The patient noted no past surgery on the left hip(s).  Patient currently rates pain in the left hip at 10 out of 10 with activity. Patient has night pain, worsening of pain with activity and weight bearing, trendelenberg gait, pain that interfers with activities of daily living and pain with passive range of motion. Patient has evidence of subchondral cysts, subchondral sclerosis, periarticular osteophytes and joint space narrowing by imaging studies. This condition presents safety issues increasing the risk of falls.  There is no current active infection.  Patient Active Problem List   Diagnosis Date Noted  . Unilateral primary osteoarthritis, left hip 01/31/2017  . Pain in left hip 01/31/2017   Past Medical History:  Diagnosis Date  . Arthritis   . Sleep apnea    [place]    Past Surgical History:  Procedure Laterality Date  . FRACTURE SURGERY     titanuim rod right femur  1974  . HERNIA REPAIR     rih with mesh  1982    Current Facility-Administered Medications  Medication Dose Route Frequency Provider Last Rate Last Dose  . chlorhexidine (HIBICLENS) 4 % liquid 4 application  60 mL Topical Once Richardean Canallark, Gilbert W, PA-C      . clindamycin (CLEOCIN) IVPB 900 mg  900 mg Intravenous To SS-Surg Kathryne HitchBlackman, Matalyn Nawaz Y, MD      .  lactated ringers infusion   Intravenous Continuous Sharee HolsterMassagee, Terry, MD 10 mL/hr at 03/13/17 1055    . tranexamic acid (CYKLOKAPRON) 1,000 mg in sodium chloride 0.9 % 100 mL IVPB  1,000 mg Intravenous To OR Kathryne HitchBlackman, Bethann Qualley Y, MD       Allergies  Allergen Reactions  . Penicillins Anaphylaxis, Hives and Other (See Comments)    PATIENT HAS HAD A PCN REACTION WITH IMMEDIATE RASH, FACIAL/TONGUE/THROAT SWELLING, SOB, OR LIGHTHEADEDNESS WITH HYPOTENSION:  #  #  #  YES  #  #  #   Has patient had a PCN reaction causing severe rash involving mucus membranes or skin necrosis: No Has patient had a PCN reaction that required hospitalization: No Has patient had a PCN reaction occurring within the last 10 years: No     Social History   Tobacco Use  . Smoking status: Former Smoker    Packs/day: 0.25    Years: 3.00    Pack years: 0.75    Last attempt to quit: 03/01/1969    Years since quitting: 48.0  . Smokeless tobacco: Never Used  Substance Use Topics  . Alcohol use: Yes    Alcohol/week: 5.4 oz    Types: 6 Glasses of wine, 3 Cans of beer per week    History reviewed. No pertinent family history.   Review of Systems  Musculoskeletal: Positive for joint pain.    Objective:  Physical Exam  Constitutional: He is oriented to person, place, and time. He appears well-developed and well-nourished.  HENT:  Head: Normocephalic and atraumatic.  Eyes: EOM are normal. Pupils are equal, round, and reactive to light.  Neck: Normal range of motion. Neck supple.  Cardiovascular: Normal rate and regular rhythm.  Respiratory: Effort normal and breath sounds normal.  GI: Soft. Bowel sounds are normal.  Musculoskeletal:       Left hip: He exhibits decreased range of motion, decreased strength, tenderness and bony tenderness.  Neurological: He is alert and oriented to person, place, and time.  Skin: Skin is warm and dry.  Psychiatric: He has a normal mood and affect.    Vital signs in last 24  hours: Temp:  [98.3 F (36.8 C)] 98.3 F (36.8 C) (12/18 1103) Pulse Rate:  [70] 70 (12/18 1103) Resp:  [20] 20 (12/18 1103) BP: (145)/(96) 145/96 (12/18 1103) SpO2:  [98 %] 98 % (12/18 1103) Weight:  [213 lb (96.6 kg)] 213 lb (96.6 kg) (12/18 1051)  Labs:   Estimated body mass index is 30.56 kg/m as calculated from the following:   Height as of this encounter: 5\' 10"  (1.778 m).   Weight as of this encounter: 213 lb (96.6 kg).   Imaging Review Plain radiographs demonstrate severe degenerative joint disease of the left hip(s). The bone quality appears to be excellent for age and reported activity level.  Assessment/Plan:  End stage arthritis, left hip(s)  The patient history, physical examination, clinical judgement of the provider and imaging studies are consistent with end stage degenerative joint disease of the left hip(s) and total hip arthroplasty is deemed medically necessary. The treatment options including medical management, injection therapy, arthroscopy and arthroplasty were discussed at length. The risks and benefits of total hip arthroplasty were presented and reviewed. The risks due to aseptic loosening, infection, stiffness, dislocation/subluxation,  thromboembolic complications and other imponderables were discussed.  The patient acknowledged the explanation, agreed to proceed with the plan and consent was signed. Patient is being admitted for inpatient treatment for surgery, pain control, PT, OT, prophylactic antibiotics, VTE prophylaxis, progressive ambulation and ADL's and discharge planning.The patient is planning to be discharged home with home health services

## 2017-03-13 NOTE — Transfer of Care (Signed)
Immediate Anesthesia Transfer of Care Note  Patient: Jay Watson  Procedure(s) Performed: LEFT TOTAL HIP ARTHROPLASTY ANTERIOR APPROACH (Left )  Patient Location: PACU  Anesthesia Type:General  Level of Consciousness: awake, alert , oriented and patient cooperative  Airway & Oxygen Therapy: Patient Spontanous Breathing  Post-op Assessment: Report given to RN and Post -op Vital signs reviewed and stable  Post vital signs: Reviewed and stable  Last Vitals:  Vitals:   03/13/17 1103 03/13/17 1508  BP: (!) 145/96   Pulse: 70   Resp: 20   Temp: 36.8 C (P) 36.6 C  SpO2: 98%     Last Pain:  Vitals:   03/13/17 1103  TempSrc: Oral         Complications: No apparent anesthesia complications

## 2017-03-13 NOTE — Brief Op Note (Signed)
03/13/2017  2:48 PM  PATIENT:  Jay Watson  66 y.o. male  PRE-OPERATIVE DIAGNOSIS:  Osteoarthritis Left Hip  POST-OPERATIVE DIAGNOSIS:  Osteoarthritis Left Hip  PROCEDURE:  Procedure(s): LEFT TOTAL HIP ARTHROPLASTY ANTERIOR APPROACH (Left)  SURGEON:  Surgeon(s) and Role:    Kathryne Hitch* Deerica Waszak Y, MD - Primary  PHYSICIAN ASSISTANT: Rexene EdisonGil Clark, PA-C  ANESTHESIA:   general  EBL:  400 mL   COUNTS:  YES  DICTATION: .Other Dictation: Dictation Number [id number]  PLAN OF CARE: Admit to inpatient   PATIENT DISPOSITION:  PACU - hemodynamically stable.   Delay start of Pharmacological VTE agent (>24hrs) due to surgical blood loss or risk of bleeding: no

## 2017-03-14 ENCOUNTER — Encounter (HOSPITAL_COMMUNITY): Payer: Self-pay | Admitting: Orthopaedic Surgery

## 2017-03-14 LAB — BASIC METABOLIC PANEL
Anion gap: 11 (ref 5–15)
BUN: 16 mg/dL (ref 6–20)
CHLORIDE: 100 mmol/L — AB (ref 101–111)
CO2: 24 mmol/L (ref 22–32)
CREATININE: 0.79 mg/dL (ref 0.61–1.24)
Calcium: 8.8 mg/dL — ABNORMAL LOW (ref 8.9–10.3)
GFR calc Af Amer: >60 mL/min (ref >60)
GFR calc non Af Amer: >60 mL/min (ref >60)
Glucose, Bld: 116 mg/dL — ABNORMAL HIGH (ref 65–99)
Potassium: 4.1 mmol/L (ref 3.5–5.1)
SODIUM: 135 mmol/L (ref 135–145)

## 2017-03-14 LAB — CBC
HCT: 38.1 % — ABNORMAL LOW (ref 39.0–52.0)
HEMOGLOBIN: 12.7 g/dL — AB (ref 13.0–17.0)
MCH: 29.1 pg (ref 26.0–34.0)
MCHC: 33.3 g/dL (ref 30.0–36.0)
MCV: 87.2 fL (ref 78.0–100.0)
Platelets: 243 10*3/uL (ref 150–400)
RBC: 4.37 MIL/uL (ref 4.22–5.81)
RDW: 12.3 % (ref 11.5–15.5)
WBC: 13 10*3/uL — ABNORMAL HIGH (ref 4.0–10.5)

## 2017-03-14 NOTE — Progress Notes (Signed)
Subjective: 1 Day Post-Op Procedure(s) (LRB): LEFT TOTAL HIP ARTHROPLASTY ANTERIOR APPROACH (Left) Patient reports pain as moderate.    Objective: Vital signs in last 24 hours: Temp:  [97.4 F (36.3 C)-98.3 F (36.8 C)] 98 F (36.7 C) (12/19 0529) Pulse Rate:  [62-80] 62 (12/19 0529) Resp:  [9-21] 17 (12/19 0529) BP: (98-145)/(72-96) 114/72 (12/19 0529) SpO2:  [94 %-99 %] 96 % (12/19 0529) Weight:  [213 lb (96.6 kg)] 213 lb (96.6 kg) (12/18 1051)  Intake/Output from previous day: 12/18 0701 - 12/19 0700 In: 3502.5 [P.O.:480; I.V.:2862.5; IV Piggyback:160] Out: 400 [Blood:400] Intake/Output this shift: Total I/O In: 1342.5 [P.O.:480; I.V.:862.5] Out: -   No results for input(s): HGB in the last 72 hours. No results for input(s): WBC, RBC, HCT, PLT in the last 72 hours. No results for input(s): NA, K, CL, CO2, BUN, CREATININE, GLUCOSE, CALCIUM in the last 72 hours. No results for input(s): LABPT, INR in the last 72 hours.  Sensation intact distally Intact pulses distally Dorsiflexion/Plantar flexion intact Incision: dressing C/D/I  Assessment/Plan: 1 Day Post-Op Procedure(s) (LRB): LEFT TOTAL HIP ARTHROPLASTY ANTERIOR APPROACH (Left) Up with therapy Plan for discharge tomorrow Discharge home with home health  Jay Watson 03/14/2017, 6:59 AM

## 2017-03-14 NOTE — Op Note (Signed)
NAME:  Jay Watson                   ACCOUNT NO.:  MEDICAL RECORD NO.:  [id number]  LOCATION:                                 FACILITY:  PHYSICIAN:  Vanita PandaChristopher Y. Magnus IvanBlackman, M.D.DATE OF BIRTH:  DATE OF PROCEDURE:  03/13/2017 DATE OF DISCHARGE:                              OPERATIVE REPORT   PREOPERATIVE DIAGNOSIS:  Primary osteoarthritis and degenerative joint disease, left hip.  POSTOPERATIVE DIAGNOSIS:  Primary osteoarthritis and degenerative joint disease, left hip.  PROCEDURE:  Left total hip arthroplasty through direct anterior approach.  IMPLANTS:  DePuy Sector Gription acetabular component size 58, size 36 +4 neutral polyethylene liner, size 13 Corail femoral component with varus offset, size 36 +5 ceramic hip ball.  SURGEON:  Vanita PandaChristopher Y. Magnus IvanBlackman, M.D.  ASSISTANT:  Richardean CanalGilbert Clark, PA-C.  ANESTHESIA:  General.  ANTIBIOTICS:  IV clindamycin 900 mg.  BLOOD LOSS:  350-400 mL.  COMPLICATIONS:  None.  INDICATIONS:  Mr. Jay Watson is a very healthy 66 year old gentleman with debilitating arthritis involving his left hip.  This is well documented with the clinical exam and x-rays.  He has tried and failed all forms of conservative treatment and his pain has been worsening.  At this point, his leg length shows that his left leg is shorter than right.  His pain has been detrimentally affecting his mobility as well as activity of daily living and his quality of life.  At this point, he does wish to proceed with a total hip arthroplasty through direct anterior approach. We had a long and thorough discussion about this approach as well as discussed the risk of acute blood loss anemia, nerve and vessel injury, fracture, infection, dislocation, DVT.  He understands our goals are to decrease pain, improve mobility, and overall improved quality of life.  PROCEDURE DESCRIPTION:  After informed consent was obtained, appropriate left hip was marked.  He was brought to the  operating room, where general anesthesia was obtained while he was on the stretcher.  We measured his leg length and he is definitely shorter on left than right. Traction boots were placed on both his feet.  He was placed supine on the Hana operating table, the perineal post in place, and both legs in InLine skeletal traction devices, but no traction applied.  His left operative hip was prepped and draped with DuraPrep and sterile drapes.  A time-out was called.  He was identified as correct patient and correct left hip.  I then made an incision just inferior and posterior to the anterior superior iliac spine and carried obliquely down the leg.  We dissected down to tensor fascia lata muscle. Tensor fascia was then divided longitudinally to proceed with a direct anterior approach to the hip.  We identified and cauterized the circumflex vessels and identified the hip capsule.  I opened up the hip capsule in an L-type format, finding a moderate joint effusion and significant arthritis throughout the femoral head and neck area.  We placed Cobra retractors around the medial and lateral femoral neck and then made our femoral neck cut with an oscillating saw just proximal to the lesser trochanter and completed this on osteotome.  We placed a corkscrew  guide in the femoral head and removed the femoral head in its entirety.  We found a large area of devoid of cartilage.  We then placed a bent Hohmann over the medial acetabular rim and removed remnants of acetabular labrum.  We then began reaming under direct visualization from a size 43 reamer and jumping up several increments and eventually going all the way in stepwise increments up to a size 58 with all reamers under direct visualization, the last reamer under direct fluoroscopy, so we could obtain our depth of reaming, our inclination, and anteversion.  Once I was pleased with this, I placed a real DePuy Sector Gription acetabular component  size 58 and a 36 +4 polyethylene liner for that size acetabular component.  Attention was then turned to the femur.  With the leg externally rotated to 120 degrees extended and adducted, we were able to use a Mueller retractor medially and a Hohmann retractor behind the greater trochanter.  I released the lateral joint capsule and used a box cutting osteotome to enter the femoral canal and a rongeur to lateralize.  I then began broaching using the Corail broaching system from a size 8 going up to a size 13 with all trials under direct visualization.  Once we got to a 13, we trialed a varus offset femoral neck based off his anatomy and a 36 +1.5 hip ball.  We brought the leg back over and up with traction and internal rotation reducing the pelvis.  We were pleased with stability, but we thought he needed just a little bit more leg length and offset.  When using, it will be tight given the fact that he had been living short for an extended period of time.  We then dislocated the hip and removed the trial components.  We were able to place the real Corail femoral component size 13 with varus offset and the real 36 +5 hip ball, reduced this in the acetabulum, and we felt like his leg length, offset and range of motion and stability were all good.  This was all verified under fluoroscopy as well.  We then irrigated the soft tissue with normal saline solution using pulsatile lavage.  We closed the joint capsule with interrupted #1 Ethibond suture, followed by running #1 Vicryl in the tensor fascia, 0 Vicryl in the deep tissue, 2-0 Vicryl in the subcutaneous tissue, 4-0 Monocryl subcuticular stitch and Steri- Strips on the skin.  An Aquacel dressing was applied.  He was taken off the Hana table, awakened, extubated and taken to the recovery room in stable condition.  All final counts were correct.  There were no complications noted.     Vanita Pandahristopher Y. Magnus IvanBlackman, M.D.     CYB/MEDQ  D:   03/13/2017  T:  03/13/2017  Job:  [id number]

## 2017-03-14 NOTE — Evaluation (Signed)
Occupational Therapy Evaluation Patient Details Name: Jay Watson MRN: [id number] DOB: 1950/07/03 Today's Date: 03/14/2017    History of Present Illness Pt is a 66 y/o male s/p L THA. PMH includes OSA, and R femur fracture surgery.    Clinical Impression   Pt with decline in function with ADLs and ADL mobility with decreased balance and endurance. Pt would benefit from acute OT services to address impairments to maximize level of function and safety    Follow Up Recommendations  DC plan and follow up therapy as arranged by surgeon;Supervision - Intermittent    Equipment Recommendations  3 in 1 bedside commode;Tub/shower seat;Other (comment)(reacher)    Recommendations for Other Services       Precautions / Restrictions Precautions Precautions: None Restrictions Weight Bearing Restrictions: Yes LLE Weight Bearing: Weight bearing as tolerated      Mobility Bed Mobility Overal bed mobility: Needs Assistance Bed Mobility: Supine to Sit     Supine to sit: Min guard     General bed mobility comments: Increased time required secondary to pain.   Transfers Overall transfer level: Needs assistance Equipment used: Rolling walker (2 wheeled) Transfers: Sit to/from UGI CorporationStand;Stand Pivot Transfers Sit to Stand: Min assist;Min guard         General transfer comment: verbal cues for correct hand placement, sequencing    Balance Overall balance assessment: Needs assistance Sitting-balance support: No upper extremity supported;Feet supported Sitting balance-Leahy Scale: Good     Standing balance support: Bilateral upper extremity supported;During functional activity Standing balance-Leahy Scale: Poor                             ADL either performed or assessed with clinical judgement   ADL Overall ADL's : Needs assistance/impaired Eating/Feeding: Independent;Sitting   Grooming: Wash/dry hands;Wash/dry face;Standing;Min guard   Upper Body Bathing: Set  up;Sitting   Lower Body Bathing: Minimal assistance   Upper Body Dressing : Set up;Sitting   Lower Body Dressing: Minimal assistance   Toilet Transfer: Minimal assistance;Min guard;Ambulation;RW;Comfort height toilet;Cueing for safety;Cueing for sequencing   Toileting- Clothing Manipulation and Hygiene: Minimal assistance;Sit to/from stand   Tub/ Shower Transfer: Minimal assistance;Min guard;Ambulation;Rolling walker;3 in 1;Cueing for safety;Cueing for sequencing   Functional mobility during ADLs: Minimal assistance;Min guard;Rolling walker;Cueing for sequencing       Vision Baseline Vision/History: Wears glasses Wears Glasses: Reading only Patient Visual Report: No change from baseline       Perception     Praxis      Pertinent Vitals/Pain Pain Assessment: 0-10 Pain Score: 5  Pain Location: L hip  Pain Descriptors / Indicators: Aching;Operative site guarding Pain Intervention(s): Monitored during session;Premedicated before session;Repositioned     Hand Dominance Right   Extremity/Trunk Assessment Upper Extremity Assessment Upper Extremity Assessment: Overall WFL for tasks assessed   Lower Extremity Assessment Lower Extremity Assessment: Defer to PT evaluation       Communication Communication Communication: No difficulties   Cognition Arousal/Alertness: Awake/alert Behavior During Therapy: WFL for tasks assessed/performed Overall Cognitive Status: Within Functional Limits for tasks assessed                                     General Comments   pt very pleasant and cooperative; talkative    Exercises     Shoulder Instructions      Home Living Family/patient expects to be discharged to:: Private  residence Living Arrangements: Spouse/significant other Available Help at Discharge: Family;Available 24 hours/day Type of Home: House Home Access: Stairs to enter Entergy CorporationEntrance Stairs-Number of Steps: 3 Entrance Stairs-Rails: Right Home  Layout: One level     Bathroom Shower/Tub: Producer, television/film/videoWalk-in shower   Bathroom Toilet: Handicapped height     Home Equipment: None          Prior Functioning/Environment Level of Independence: Independent                 OT Problem List: Decreased activity tolerance;Decreased knowledge of use of DME or AE;Impaired balance (sitting and/or standing);Pain      OT Treatment/Interventions: Self-care/ADL training;DME and/or AE instruction;Therapeutic activities;Patient/family education    OT Goals(Current goals can be found in the care plan section) Acute Rehab OT Goals Patient Stated Goal: go home OT Goal Formulation: With patient Time For Goal Achievement: 03/28/17 Potential to Achieve Goals: Good ADL Goals Pt Will Perform Grooming: with supervision;with set-up;standing Pt Will Perform Lower Body Bathing: with min guard assist;with caregiver independent in assisting;sit to/from stand Pt Will Perform Lower Body Dressing: with min guard assist;sit to/from stand;with caregiver independent in assisting Pt Will Transfer to Toilet: with min guard assist;with supervision;ambulating Pt Will Perform Toileting - Clothing Manipulation and hygiene: with min guard assist;with supervision;sit to/from stand;with caregiver independent in assisting Pt Will Perform Tub/Shower Transfer: with min guard assist;with supervision;ambulating;rolling walker;shower seat;3 in 1;with caregiver independent in assisting  OT Frequency: Min 2X/week   Barriers to D/C:    no barriers       Co-evaluation              AM-PAC PT "6 Clicks" Daily Activity     Outcome Measure Help from another person eating meals?: None Help from another person taking care of personal grooming?: A Little Help from another person toileting, which includes using toliet, bedpan, or urinal?: A Little Help from another person bathing (including washing, rinsing, drying)?: A Little Help from another person to put on and taking off  regular upper body clothing?: None Help from another person to put on and taking off regular lower body clothing?: A Little 6 Click Score: 20   End of Session Equipment Utilized During Treatment: Gait belt;Rolling walker;Other (comment)(3 in 1)  Activity Tolerance: Patient tolerated treatment well Patient left: in chair;with call bell/phone within reach  OT Visit Diagnosis: Unsteadiness on feet (R26.81);Pain Pain - Right/Left: Left Pain - part of body: Hip                Time: 1205-1234 OT Time Calculation (min): 29 min Charges:  OT General Charges $OT Visit: 1 Visit OT Evaluation $OT Eval Low Complexity: 1 Low OT Treatments $Therapeutic Activity: 8-22 mins G-Codes: OT G-codes **NOT FOR INPATIENT CLASS** Functional Assessment Tool Used: AM-PAC 6 Clicks Daily Activity     [name], Tyan Dy Jeanette 03/14/2017, 2:27 PM

## 2017-03-14 NOTE — Progress Notes (Signed)
Physical Therapy Treatment Patient Details Name: Perfecto Kingdomhillip Madia MRN: [id number] DOB: [birth year] Today's Date: 03/14/2017    History of Present Illness Pt is a 66 y/o male s/p L THA. PMH includes OSA, and R femur fracture surgery.     PT Comments    Patient progressing well at this time. Session focused on improving activity tolerence and gait mechanics. Pt ambulating further distances with better mechanics than prior session, progressed standing therex. Next visit will address stairs.    Follow Up Recommendations  DC plan and follow up therapy as arranged by surgeon;Home health PT;Supervision for mobility/OOB     Equipment Recommendations  Rolling walker with 5" wheels;3in1 (PT)    Recommendations for Other Services       Precautions / Restrictions Precautions Precautions: None Restrictions Weight Bearing Restrictions: Yes LLE Weight Bearing: Weight bearing as tolerated    Mobility  Bed Mobility Overal bed mobility: Needs Assistance Bed Mobility: Supine to Sit     Supine to sit: Min guard     General bed mobility comments: OOB at entry  Transfers Overall transfer level: Needs assistance Equipment used: Rolling walker (2 wheeled) Transfers: Sit to/from Stand Sit to Stand: Min guard Stand pivot transfers: Min guard       General transfer comment: Min gaurd for safety. patient demonstrating safe techinique with RW  Ambulation/Gait Ambulation/Gait assistance: Min guard Ambulation Distance (Feet): 225 Feet Assistive device: Rolling walker (2 wheeled) Gait Pattern/deviations: Step-through pattern;Step-to pattern;Antalgic Gait velocity: decreased   General Gait Details: cues for heel strike and equal step length, patient able to demo intermittent. Min guard for Proofreadersafety    Stairs            Wheelchair Mobility    Modified Rankin (Stroke Patients Only)       Balance Overall balance assessment: Needs assistance Sitting-balance support: No upper  extremity supported;Feet supported Sitting balance-Leahy Scale: Good     Standing balance support: Bilateral upper extremity supported;During functional activity Standing balance-Leahy Scale: Fair Standing balance comment: Reliant on BUE support for dynamic balance                            Cognition Arousal/Alertness: Awake/alert Behavior During Therapy: WFL for tasks assessed/performed Overall Cognitive Status: Within Functional Limits for tasks assessed                                        Exercises Total Joint Exercises Hip ABduction/ADduction: AROM;Left;10 reps;Standing Knee Flexion: AROM;Left;10 reps;Standing Marching in Standing: AROM;Left;10 reps Standing Hip Extension: AROM;Left;10 reps    General Comments General comments (skin integrity, edema, etc.): VSS throughout session.      Pertinent Vitals/Pain Pain Assessment: 0-10 Pain Score: 5  Pain Location: L hip  Pain Descriptors / Indicators: Aching;Operative site guarding Pain Intervention(s): Monitored during session;Limited activity within patient's tolerance    Home Living Family/patient expects to be discharged to:: Private residence Living Arrangements: Spouse/significant other Available Help at Discharge: Family;Available 24 hours/day Type of Home: House Home Access: Stairs to enter Entrance Stairs-Rails: Right Home Layout: One level Home Equipment: None      Prior Function Level of Independence: Independent          PT Goals (current goals can now be found in the care plan section) Acute Rehab PT Goals Patient Stated Goal: go home PT Goal Formulation: With patient Time For  Goal Achievement: 03/20/17 Potential to Achieve Goals: Good Progress towards PT goals: Progressing toward goals    Frequency    7X/week      PT Plan Current plan remains appropriate    Co-evaluation              AM-PAC PT "6 Clicks" Daily Activity  Outcome Measure  Difficulty  turning over in bed (including adjusting bedclothes, sheets and blankets)?: A Little Difficulty moving from lying on back to sitting on the side of the bed? : A Little Difficulty sitting down on and standing up from a chair with arms (e.g., wheelchair, bedside commode, etc,.)?: A Little Help needed moving to and from a bed to chair (including a wheelchair)?: A Little Help needed walking in hospital room?: A Little Help needed climbing 3-5 steps with a railing? : A Little 6 Click Score: 18    End of Session Equipment Utilized During Treatment: Gait belt Activity Tolerance: Patient tolerated treatment well Patient left: in chair;with call bell/phone within reach;with family/visitor present Nurse Communication: Mobility status PT Visit Diagnosis: Other abnormalities of gait and mobility (R26.89);Pain Pain - Right/Left: Left Pain - part of body: Hip     Time: [id number] PT Time Calculation (min) (ACUTE ONLY): 34 min  Charges:  $Gait Training: 8-22 mins $Therapeutic Exercise: 8-22 mins                    G Codes:      Etta GrandchildSean Aune Adami, PT, DPT Acute Rehab Services Pager: [phone]    Etta GrandchildSean  Abbigail Anstey 03/14/2017, 2:37 PM

## 2017-03-14 NOTE — Progress Notes (Signed)
Physical Therapy Treatment Patient Details Name: Jay Watson MRN: [id number] DOB: 1951/01/19 Today's Date: 03/14/2017    History of Present Illness Pt is a 66 y/o male s/p L THA. PMH includes OSA, and R femur fracture surgery.     PT Comments    Patient continuing to progress well with therapy. Session focused on stair training, patient doing well and will require 1 subsequent session to improve independence on stairs. Patient also demonstrating improved gait mechanics and will be safe to return home once medically cleared for d/c.    Follow Up Recommendations  DC plan and follow up therapy as arranged by surgeon;Home health PT;Supervision for mobility/OOB     Equipment Recommendations  Rolling walker with 5" wheels;3in1 (PT)    Recommendations for Other Services       Precautions / Restrictions Precautions Precautions: None Restrictions Weight Bearing Restrictions: Yes LLE Weight Bearing: Weight bearing as tolerated    Mobility  Bed Mobility Overal bed mobility: Needs Assistance Bed Mobility: Supine to Sit     Supine to sit: Min guard     General bed mobility comments: OOB at entry  Transfers Overall transfer level: Needs assistance Equipment used: Rolling walker (2 wheeled) Transfers: Sit to/from Stand Sit to Stand: Min guard;Supervision Stand pivot transfers: Min guard       General transfer comment: Min gaurd for safety. patient demonstrating safe techinique with RW  Ambulation/Gait Ambulation/Gait assistance: Min guard Ambulation Distance (Feet): 300 Feet Assistive device: Rolling walker (2 wheeled) Gait Pattern/deviations: Step-through pattern;Step-to pattern;Antalgic Gait velocity: decreased   General Gait Details: pt demo improved mechanics, now step through 90% of time. Min guard to supervision for gait.    Stairs Stairs: Yes   Stair Management: One rail Right;With cane Number of Stairs: 8 General stair comments: cues for sequencing,  education for family guarding, pt amblating with R right and L cane  Wheelchair Mobility    Modified Rankin (Stroke Patients Only)       Balance Overall balance assessment: Needs assistance Sitting-balance support: No upper extremity supported;Feet supported Sitting balance-Leahy Scale: Good     Standing balance support: Bilateral upper extremity supported;During functional activity Standing balance-Leahy Scale: Fair Standing balance comment: Reliant on BUE support for dynamic balance                            Cognition Arousal/Alertness: Awake/alert Behavior During Therapy: WFL for tasks assessed/performed Overall Cognitive Status: Within Functional Limits for tasks assessed                                        Exercises Total Joint Exercises Hip ABduction/ADduction: AROM;Left;10 reps;Standing Knee Flexion: AROM;Left;10 reps;Standing Marching in Standing: AROM;Left;10 reps Standing Hip Extension: AROM;Left;10 reps    General Comments General comments (skin integrity, edema, etc.): wife present, VSS throughout session.       Pertinent Vitals/Pain Pain Assessment: 0-10 Pain Score: 4  Pain Location: L hip  Pain Descriptors / Indicators: Aching;Operative site guarding Pain Intervention(s): Limited activity within patient's tolerance;Monitored during session    Home Living Family/patient expects to be discharged to:: Private residence Living Arrangements: Spouse/significant other Available Help at Discharge: Family;Available 24 hours/day Type of Home: House Home Access: Stairs to enter Entrance Stairs-Rails: Right Home Layout: One level Home Equipment: None      Prior Function Level of Independence: Independent  PT Goals (current goals can now be found in the care plan section) Acute Rehab PT Goals Patient Stated Goal: go home PT Goal Formulation: With patient Time For Goal Achievement: 03/20/17 Potential to Achieve  Goals: Good Progress towards PT goals: Progressing toward goals    Frequency    7X/week      PT Plan Current plan remains appropriate    Co-evaluation              AM-PAC PT "6 Clicks" Daily Activity  Outcome Measure  Difficulty turning over in bed (including adjusting bedclothes, sheets and blankets)?: A Little Difficulty moving from lying on back to sitting on the side of the bed? : A Little Difficulty sitting down on and standing up from a chair with arms (e.g., wheelchair, bedside commode, etc,.)?: A Little Help needed moving to and from a bed to chair (including a wheelchair)?: A Little Help needed walking in hospital room?: A Little Help needed climbing 3-5 steps with a railing? : A Little 6 Click Score: 18    End of Session Equipment Utilized During Treatment: Gait belt Activity Tolerance: Patient tolerated treatment well Patient left: in chair;with call bell/phone within reach;with family/visitor present Nurse Communication: Mobility status PT Visit Diagnosis: Other abnormalities of gait and mobility (R26.89);Pain Pain - Right/Left: Left Pain - part of body: Hip     Time: [id number] PT Time Calculation (min) (ACUTE ONLY): 32 min  Charges:  $Gait Training: 23-37 mins $Therapeutic Exercise: 8-22 mins                    G Codes:       Etta GrandchildSean Sloka Volante, PT, DPT Acute Rehab Services Pager: [phone]     Etta GrandchildSean  Dempsey Knotek 03/14/2017, 5:13 PM

## 2017-03-15 LAB — CBC
HEMATOCRIT: 35.4 % — AB (ref 39.0–52.0)
HEMOGLOBIN: 11.6 g/dL — AB (ref 13.0–17.0)
MCH: 29.1 pg (ref 26.0–34.0)
MCHC: 32.8 g/dL (ref 30.0–36.0)
MCV: 88.7 fL (ref 78.0–100.0)
Platelets: 227 10*3/uL (ref 150–400)
RBC: 3.99 MIL/uL — AB (ref 4.22–5.81)
RDW: 12.6 % (ref 11.5–15.5)
WBC: 9.4 10*3/uL (ref 4.0–10.5)

## 2017-03-15 MED ORDER — METHOCARBAMOL 500 MG PO TABS: 500.0000 mg | ORAL_TABLET | Freq: Four times a day (QID) | ORAL | 1 refills | Status: AC | PRN

## 2017-03-15 MED ORDER — OXYCODONE-ACETAMINOPHEN 5-325 MG PO TABS: 1.0000 | ORAL_TABLET | ORAL | 0 refills | Status: DC | PRN

## 2017-03-15 MED ORDER — ASPIRIN 325 MG PO TBEC: 325.0000 mg | DELAYED_RELEASE_TABLET | Freq: Every day | ORAL | 0 refills | Status: AC

## 2017-03-15 NOTE — Progress Notes (Signed)
Subjective: 2 Days Post-Op Procedure(s) (LRB): LEFT TOTAL HIP ARTHROPLASTY ANTERIOR APPROACH (Left) Patient reports pain as moderate.  Dressed sitting in chair waiting to go home. No complaints.   Objective: Vital signs in last 24 hours: Temp:  [97.7 F (36.5 C)-98.7 F (37.1 C)] 97.7 F (36.5 C) (12/20 0425) Pulse Rate:  [77-81] 79 (12/20 0425) Resp:  [17-19] 17 (12/20 0425) BP: (114-130)/(71-85) 114/72 (12/20 0425) SpO2:  [96 %-98 %] 96 % (12/20 0425)  Intake/Output from previous day: 12/19 0701 - 12/20 0700 In: 1320 [P.O.:1320] Out: -  Intake/Output this shift: Total I/O In: 360 [P.O.:360] Out: -   Recent Labs    03/14/17 0605 03/15/17 0653  HGB 12.7* 11.6*   Recent Labs    03/14/17 0605 03/15/17 0653  WBC 13.0* 9.4  RBC 4.37 3.99*  HCT 38.1* 35.4*  PLT 243 227   Recent Labs    03/14/17 0605  NA 135  K 4.1  CL 100*  CO2 24  BUN 16  CREATININE 0.79  GLUCOSE 116*  CALCIUM 8.8*   No results for input(s): LABPT, INR in the last 72 hours.  Left hip: Incision: dressing C/D/I Compartment soft  Assessment/Plan: 2 Days Post-Op Procedure(s) (LRB): LEFT TOTAL HIP ARTHROPLASTY ANTERIOR APPROACH (Left) Discharge home with home health  Jay Watson 03/15/2017, 12:52 PM

## 2017-03-15 NOTE — Discharge Instructions (Signed)

## 2017-03-15 NOTE — Progress Notes (Signed)
Occupational Therapy Treatment Patient Details Name: Jay Watson MRN: [id number] DOB: 1951-03-12 Today's Date: 03/15/2017    History of present illness Pt is a 66 y/o male s/p L THA. PMH includes OSA, and R femur fracture surgery.    OT comments  Pt making good progress with functional goals. Pt to d/c home today with family assist  Follow Up Recommendations  DC plan and follow up therapy as arranged by surgeon;Supervision - Intermittent    Equipment Recommendations  3 in 1 bedside commode;Tub/shower seat;Other (comment)(reacher)    Recommendations for Other Services      Precautions / Restrictions Precautions Precautions: None Precaution Comments: reviewed no pillow under knee Restrictions Weight Bearing Restrictions: Yes LLE Weight Bearing: Weight bearing as tolerated       Mobility Bed Mobility               General bed mobility comments: up in recliner upon OT arrival  Transfers Overall transfer level: Needs assistance Equipment used: Rolling walker (2 wheeled) Transfers: Sit to/from Stand Sit to Stand: Supervision Stand pivot transfers: Supervision       General transfer comment: supervision, safe techinque with RW    Balance Overall balance assessment: Needs assistance Sitting-balance support: No upper extremity supported;Feet supported Sitting balance-Leahy Scale: Good     Standing balance support: Bilateral upper extremity supported;During functional activity Standing balance-Leahy Scale: Fair                             ADL either performed or assessed with clinical judgement   ADL       Grooming: Wash/dry hands;Wash/dry face;Standing;Supervision/safety;Set up       Lower Body Bathing: Minimal assistance;With caregiver independent assisting       Lower Body Dressing: Minimal assistance;With caregiver independent assisting   Toilet Transfer: Ambulation;RW;Comfort height toilet;Supervision/safety;With caregiver  independent assisting   Toileting- Clothing Manipulation and Hygiene: Sit to/from stand;Min guard;With caregiver independent assisting   Tub/ Shower Transfer: Ambulation;Rolling walker;3 in 1;With caregiver independent assisting;Supervision/safety   Functional mobility during ADLs: Rolling walker;Supervision/safety;Caregiver able to provide necessary level of assistance  reviewed ADL mobility safety, dressing techniques with pt and his wife     Vision Patient Visual Report: No change from baseline     Perception     Praxis      Cognition Arousal/Alertness: Awake/alert Behavior During Therapy: WFL for tasks assessed/performed Overall Cognitive Status: Within Functional Limits for tasks assessed                                          Exercises     Shoulder Instructions       General Comments  pt pleasant and cooperative    Pertinent Vitals/ Pain       Pain Assessment: 0-10 Pain Score: 3  Pain Location: L hip  Pain Descriptors / Indicators: Aching;Operative site guarding Pain Intervention(s): Monitored during session;Premedicated before session;Repositioned  Home Living                                          Prior Functioning/Environment              Frequency  Min 2X/week        Progress Toward Goals  OT Goals(current goals  can now be found in the care plan section)  Progress towards OT goals: Progressing toward goals  Acute Rehab OT Goals Patient Stated Goal: go home  Plan Discharge plan remains appropriate    Co-evaluation                 AM-PAC PT "6 Clicks" Daily Activity     Outcome Measure   Help from another person eating meals?: None Help from another person taking care of personal grooming?: A Little Help from another person toileting, which includes using toliet, bedpan, or urinal?: A Little Help from another person bathing (including washing, rinsing, drying)?: A Little Help from another  person to put on and taking off regular upper body clothing?: None Help from another person to put on and taking off regular lower body clothing?: A Little 6 Click Score: 20    End of Session Equipment Utilized During Treatment: Gait belt;Rolling walker;Other (comment)(3 in 1)  OT Visit Diagnosis: Unsteadiness on feet (R26.81);Pain Pain - Right/Left: Left Pain - part of body: Hip   Activity Tolerance Patient tolerated treatment well   Patient Left in chair;with call bell/phone within reach;with family/visitor present   Nurse Communication      Functional Assessment Tool Used: AM-PAC 6 Clicks Daily Activity   Time: 1120-1136 OT Time Calculation (min): 16 min  Charges: OT G-codes **NOT FOR INPATIENT CLASS** Functional Assessment Tool Used: AM-PAC 6 Clicks Daily Activity OT General Charges $OT Visit: 1 Visit OT Treatments $Self Care/Home Management : 8-22 mins     Jay ManilaSpencer, Jay Watson Jay Watson 03/15/2017, 1:44 PM

## 2017-03-15 NOTE — Progress Notes (Signed)
Discharge instructions and prescriptions reviewed with patient. Verbalized understanding. IV removed, catheter tip intact. No questions/concerns at this time. Transported via wheelchair to lobby for discharge.

## 2017-03-15 NOTE — Discharge Summary (Signed)
Patient ID: Jay Watson MRN: [id number] DOB/AGE: 06-29-1950 66 y.o.  Admit date: 03/13/2017 Discharge date: 03/15/2017  Admission Diagnoses:  Principal Problem:   Unilateral primary osteoarthritis, left hip Active Problems:   Status post total replacement of left hip   Discharge Diagnoses:  Same  Past Medical History:  Diagnosis Date  . Arthritis   . Sleep apnea    [place]    Surgeries: Procedure(s): LEFT TOTAL HIP ARTHROPLASTY ANTERIOR APPROACH on 03/13/2017   Consultants:   Discharged Condition: Improved  Hospital Course: Jay Watson is an 66 y.o. male who was admitted 03/13/2017 for operative treatment ofUnilateral primary osteoarthritis, left hip. Patient has severe unremitting pain that affects sleep, daily activities, and work/hobbies. After pre-op clearance the patient was taken to the operating room on 03/13/2017 and underwent  Procedure(s): LEFT TOTAL HIP ARTHROPLASTY ANTERIOR APPROACH.    Patient was given perioperative antibiotics:  Anti-infectives (From admission, onward)   Start     Dose/Rate Route Frequency Ordered Stop   03/13/17 2000  clindamycin (CLEOCIN) IVPB 600 mg     600 mg 100 mL/hr over 30 Minutes Intravenous Every 6 hours 03/13/17 1641 03/14/17 0914   03/13/17 1200  clindamycin (CLEOCIN) IVPB 900 mg     900 mg 100 mL/hr over 30 Minutes Intravenous To ShortStay Surgical 03/12/17 1341 03/13/17 1330       Patient was given sequential compression devices, early ambulation, and chemoprophylaxis to prevent DVT.  Patient benefited maximally from hospital stay and there were no complications.    Recent vital signs:  Patient Vitals for the past 24 hrs:  BP Temp Temp src Pulse Resp SpO2  03/15/17 0425 114/72 97.7 F (36.5 C) Axillary 79 17 96 %  03/14/17 2023 130/85 98.2 F (36.8 C) Oral 81 19 98 %  03/14/17 1356 120/71 98.7 F (37.1 C) Oral 77 17 98 %     Recent laboratory studies:  Recent Labs    03/14/17 0605  03/15/17 0653  WBC 13.0* 9.4  HGB 12.7* 11.6*  HCT 38.1* 35.4*  PLT 243 227  NA 135  --   K 4.1  --   CL 100*  --   CO2 24  --   BUN 16  --   CREATININE 0.79  --   GLUCOSE 116*  --   CALCIUM 8.8*  --      Discharge Medications:   Allergies as of 03/15/2017      Reactions   Penicillins Anaphylaxis, Hives, Other (See Comments)   PATIENT HAS HAD A PCN REACTION WITH IMMEDIATE RASH, FACIAL/TONGUE/THROAT SWELLING, SOB, OR LIGHTHEADEDNESS WITH HYPOTENSION:  #  #  #  YES  #  #  #   Has patient had a PCN reaction causing severe rash involving mucus membranes or skin necrosis: No Has patient had a PCN reaction that required hospitalization: No Has patient had a PCN reaction occurring within the last 10 years: No      Medication List    TAKE these medications   aspirin 325 MG EC tablet Take 1 tablet (325 mg total) by mouth daily with breakfast. Start taking on:  03/16/2017   CALCIUM PO Take 1 tablet by mouth daily.   MAGNESIUM PO Take 1 tablet by mouth daily.   methocarbamol 500 MG tablet Commonly known as:  ROBAXIN Take 1 tablet (500 mg total) by mouth every 6 (six) hours as needed for muscle spasms.   MULTIVITAMIN PO Take 1 tablet by mouth daily.  naproxen sodium 220 MG tablet Commonly known as:  ALEVE Take 440 mg by mouth 2 (two) times daily as needed (for pain or headache).   oxyCODONE-acetaminophen 5-325 MG tablet Commonly known as:  ROXICET Take 1-2 tablets by mouth every 4 (four) hours as needed for severe pain.            Durable Medical Equipment  (From admission, onward)        Start     Ordered   03/13/17 1642  DME Walker rolling  Once    Question:  Patient needs a walker to treat with the following condition  Answer:  Status post total replacement of left hip   03/13/17 1641   03/13/17 1642  DME 3 n 1  Once     03/13/17 1641      Diagnostic Studies: Dg Pelvis Portable  Result Date: 03/13/2017 CLINICAL DATA:  Status post left hip  replacement. EXAM: PORTABLE PELVIS 1-2 VIEWS COMPARISON:  Intraoperative fluoroscopic images earlier today. Hip radiographs 01/31/2017. FINDINGS: Sequelae of left total hip arthroplasty are identified. The prosthetic femoral and acetabular components are normally located on this single AP image. Postoperative gas is noted in the adjacent soft tissues. No acute periprosthetic fracture is identified. An intramedullary nail is partially visualized in the right femur. IMPRESSION: Left total hip arthroplasty without evidence of acute complication. Electronically Signed   By: Sebastian AcheAllen  Grady M.D.   On: 03/13/2017 15:43   Dg C-arm 1-60 Min  Result Date: 03/13/2017 CLINICAL DATA:  Left hip replacement. EXAM: DG C-ARM 61-120 MIN; OPERATIVE LEFT HIP WITH PELVIS COMPARISON:  No recent prior . FINDINGS: Total left hip replacement. Hardware intact. Anatomic alignment. No acute bony abnormality. IMPRESSION: Total left hip replacement with anatomic alignment. Electronically Signed   By: Maisie Fushomas  Register   On: 03/13/2017 14:44   Dg Hip Operative Unilat With Pelvis Left  Result Date: 03/13/2017 CLINICAL DATA:  Left hip replacement. EXAM: DG C-ARM 61-120 MIN; OPERATIVE LEFT HIP WITH PELVIS COMPARISON:  No recent prior . FINDINGS: Total left hip replacement. Hardware intact. Anatomic alignment. No acute bony abnormality. IMPRESSION: Total left hip replacement with anatomic alignment. Electronically Signed   By: Maisie Fushomas  Register   On: 03/13/2017 14:44    Disposition: Final discharge disposition not confirmed    Follow-up Information    Kathryne HitchBlackman, Christopher Y, MD. Schedule an appointment as soon as possible for a visit in 2 week(s).   Specialty:  Orthopedic Surgery Contact information: [address] [place] [address] [phone]            Signed: Richardean CanalGILBERT CLARK 03/15/2017, 12:52 PM

## 2017-03-15 NOTE — Progress Notes (Signed)
Physical Therapy Treatment and Discharge Patient Details Name: Jay Watson MRN: [id number] DOB: 1950-11-29 Today's Date: 03/15/2017    History of Present Illness Pt is a 66 y/o male s/p L THA. PMH includes OSA, and R femur fracture surgery.     PT Comments    Session focused on reinforcing stair training from yesterday, patient now supervision level with all mobility and demonstrating safety with stairs. Pt and family educated on safety considerations for home and have no further questions or concerns at this time. Pt has met all functional goals and will benefit from skilled home health PT when medically cleared for d/c.     Follow Up Recommendations  DC plan and follow up therapy as arranged by surgeon;Home health PT;Supervision for mobility/OOB     Equipment Recommendations  Rolling walker with 5" wheels;3in1 (PT)    Recommendations for Other Services       Precautions / Restrictions Precautions Precautions: None Restrictions Weight Bearing Restrictions: Yes LLE Weight Bearing: Weight bearing as tolerated    Mobility  Bed Mobility               General bed mobility comments: OOB at entry  Transfers Overall transfer level: Needs assistance Equipment used: Rolling walker (2 wheeled) Transfers: Sit to/from Stand Sit to Stand: Supervision Stand pivot transfers: Supervision       General transfer comment: supervision, safe techinque with RW  Ambulation/Gait Ambulation/Gait assistance: Supervision Ambulation Distance (Feet): 300 Feet Assistive device: Rolling walker (2 wheeled) Gait Pattern/deviations: Step-through pattern;Antalgic Gait velocity: decreased   General Gait Details: supervision with gait, step through pattern and safe balance with RW   Stairs Stairs: Yes   Stair Management: One rail Right;With cane Number of Stairs: 4 General stair comments: close supervision, patient safe dynamic balance with cane and 1 rail, proper sequencing without  cues.  Wheelchair Mobility    Modified Rankin (Stroke Patients Only)       Balance Overall balance assessment: Needs assistance Sitting-balance support: No upper extremity supported;Feet supported Sitting balance-Leahy Scale: Good     Standing balance support: Bilateral upper extremity supported;During functional activity Standing balance-Leahy Scale: Fair Standing balance comment: Reliant on BUE support for dynamic balance                            Cognition Arousal/Alertness: Awake/alert Behavior During Therapy: WFL for tasks assessed/performed Overall Cognitive Status: Within Functional Limits for tasks assessed                                        Exercises      General Comments        Pertinent Vitals/Pain Pain Assessment: 0-10 Pain Score: 4  Pain Location: L hip  Pain Descriptors / Indicators: Aching;Operative site guarding Pain Intervention(s): Limited activity within patient's tolerance;Monitored during session    Home Living                      Prior Function            PT Goals (current goals can now be found in the care plan section) Acute Rehab PT Goals Patient Stated Goal: go home PT Goal Formulation: With patient Time For Goal Achievement: 03/20/17 Potential to Achieve Goals: Good Progress towards PT goals: Goals met/education completed, patient discharged from PT    Frequency  PT Plan Current plan remains appropriate    Co-evaluation              AM-PAC PT "6 Clicks" Daily Activity  Outcome Measure  Difficulty turning over in bed (including adjusting bedclothes, sheets and blankets)?: A Little Difficulty moving from lying on back to sitting on the side of the bed? : A Little Difficulty sitting down on and standing up from a chair with arms (e.g., wheelchair, bedside commode, etc,.)?: A Little Help needed moving to and from a bed to chair (including a wheelchair)?: A  Little Help needed walking in hospital room?: A Little Help needed climbing 3-5 steps with a railing? : A Little 6 Click Score: 18    End of Session Equipment Utilized During Treatment: Gait belt Activity Tolerance: Patient tolerated treatment well Patient left: in chair;with call bell/phone within reach;with family/visitor present Nurse Communication: Mobility status PT Visit Diagnosis: Other abnormalities of gait and mobility (R26.89);Pain Pain - Right/Left: Left Pain - part of body: Hip     Time: 4314-2767 PT Time Calculation (min) (ACUTE ONLY): 30 min  Charges:  $Gait Training: 23-37 mins                    G Codes:       Reinaldo Berber, PT, DPT Acute Rehab Services Pager: [phone]     Reinaldo Berber 03/15/2017, 9:35 AM

## 2017-03-16 ENCOUNTER — Telehealth (INDEPENDENT_AMBULATORY_CARE_PROVIDER_SITE_OTHER): Payer: Self-pay | Admitting: Orthopaedic Surgery

## 2017-03-16 NOTE — Telephone Encounter (Signed)
IC verbal order provided.

## 2017-03-16 NOTE — Telephone Encounter (Signed)
[facility] verbal orders for patient: 1x for 2 weeks 2x for 2 weeks 1x for 1 week  CB # [phone]

## 2017-03-22 NOTE — Addendum Note (Signed)
Addendum  created 03/22/17 [id number] by Sharee HolsterMassagee, Yavuz Kirby, MD   Intraprocedure Staff edited

## 2017-03-26 ENCOUNTER — Other Ambulatory Visit (INDEPENDENT_AMBULATORY_CARE_PROVIDER_SITE_OTHER): Payer: Self-pay | Admitting: Orthopaedic Surgery

## 2017-03-26 ENCOUNTER — Telehealth (INDEPENDENT_AMBULATORY_CARE_PROVIDER_SITE_OTHER): Payer: Self-pay | Admitting: Orthopaedic Surgery

## 2017-03-26 MED ORDER — OXYCODONE-ACETAMINOPHEN 5-325 MG PO TABS: 1.0000 | ORAL_TABLET | ORAL | 0 refills | Status: AC | PRN

## 2017-03-26 NOTE — Telephone Encounter (Signed)
Someone can come and pick up a prescription for him.

## 2017-03-26 NOTE — Telephone Encounter (Signed)
Patient aware Rx ready at front desk  

## 2017-03-26 NOTE — Telephone Encounter (Signed)
Please advise 

## 2017-03-26 NOTE — Telephone Encounter (Signed)
Patient called left voicemail message needing Rx refilled (Oxycodone) The number to contact patient is [phone]

## 2017-03-28 ENCOUNTER — Ambulatory Visit (INDEPENDENT_AMBULATORY_CARE_PROVIDER_SITE_OTHER): Payer: Medicare Other | Admitting: Orthopaedic Surgery

## 2017-03-28 ENCOUNTER — Encounter (INDEPENDENT_AMBULATORY_CARE_PROVIDER_SITE_OTHER): Payer: Self-pay | Admitting: Orthopaedic Surgery

## 2017-03-28 DIAGNOSIS — Z96642 Presence of left artificial hip joint: Secondary | ICD-10-CM

## 2017-03-28 NOTE — Progress Notes (Signed)
The patient is 2 weeks status post a left total hip arthroplasty.  He said he is doing great.  On exam his incision looks great I removed all Steri-Strips in place new Steri-Strips.  I felt that he has seroma but I was only able to drain about 10-15 since he is off of the soft tissue.  There is no evidence of infection.  His ligaments are equal.  He is walking without a limp or assistive device.  This point he is doing so well we will see him back in 4 weeks to see how is doing overall for looks good at that visit we will need to see him back to the 1 year follow-up.  All questions and concerns were answered and addressed.

## 2017-03-29 ENCOUNTER — Telehealth (INDEPENDENT_AMBULATORY_CARE_PROVIDER_SITE_OTHER): Payer: Self-pay | Admitting: Orthopaedic Surgery

## 2017-03-29 NOTE — Telephone Encounter (Signed)
Denise aware that this is ok to do

## 2017-03-29 NOTE — Telephone Encounter (Signed)
Jay BlonderDenise wanted to let Dr. Magnus IvanBlackman know that patient has requested early release/ discharge from physical therapy. She would like a call back with confirmation of this message. Her number is [phone]

## 2017-03-29 NOTE — Telephone Encounter (Signed)
That sounds fine

## 2017-03-29 NOTE — Telephone Encounter (Signed)
Please advise 

## 2017-04-02 ENCOUNTER — Other Ambulatory Visit (INDEPENDENT_AMBULATORY_CARE_PROVIDER_SITE_OTHER): Payer: Self-pay | Admitting: Physician Assistant

## 2017-04-02 NOTE — Telephone Encounter (Signed)
Does not need from our standpoint

## 2017-04-02 NOTE — Telephone Encounter (Signed)
Advise

## 2019-11-25 IMAGING — RF DG C-ARM 61-120 MIN
1 series · 2 of 2 positions shown · non-contrast
Comparison: No recent prior .

CLINICAL DATA: Left hip replacement.

EXAM:
DG C-ARM 61-120 MIN; OPERATIVE LEFT HIP WITH PELVIS

[Series 1: run · 2 of 2 slices shown]
[im 1/2]
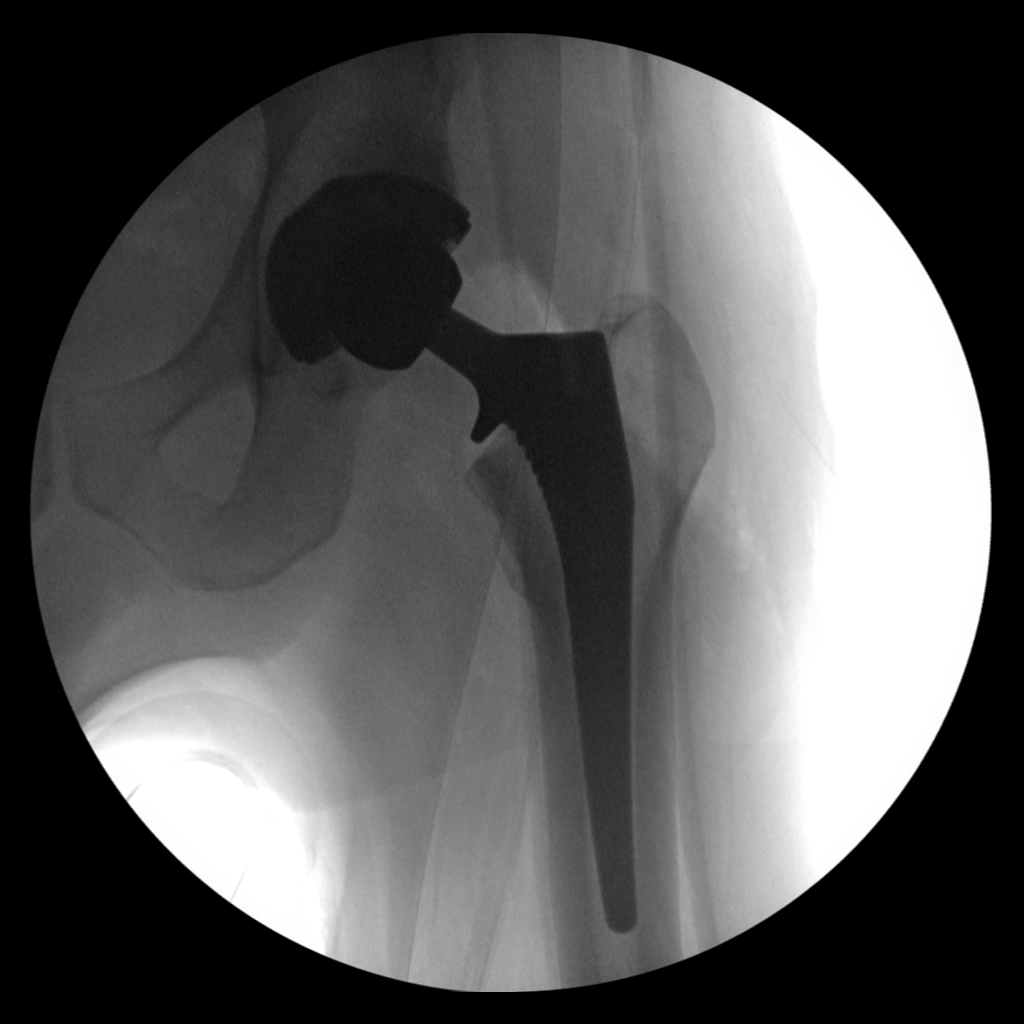
[im 2/2]
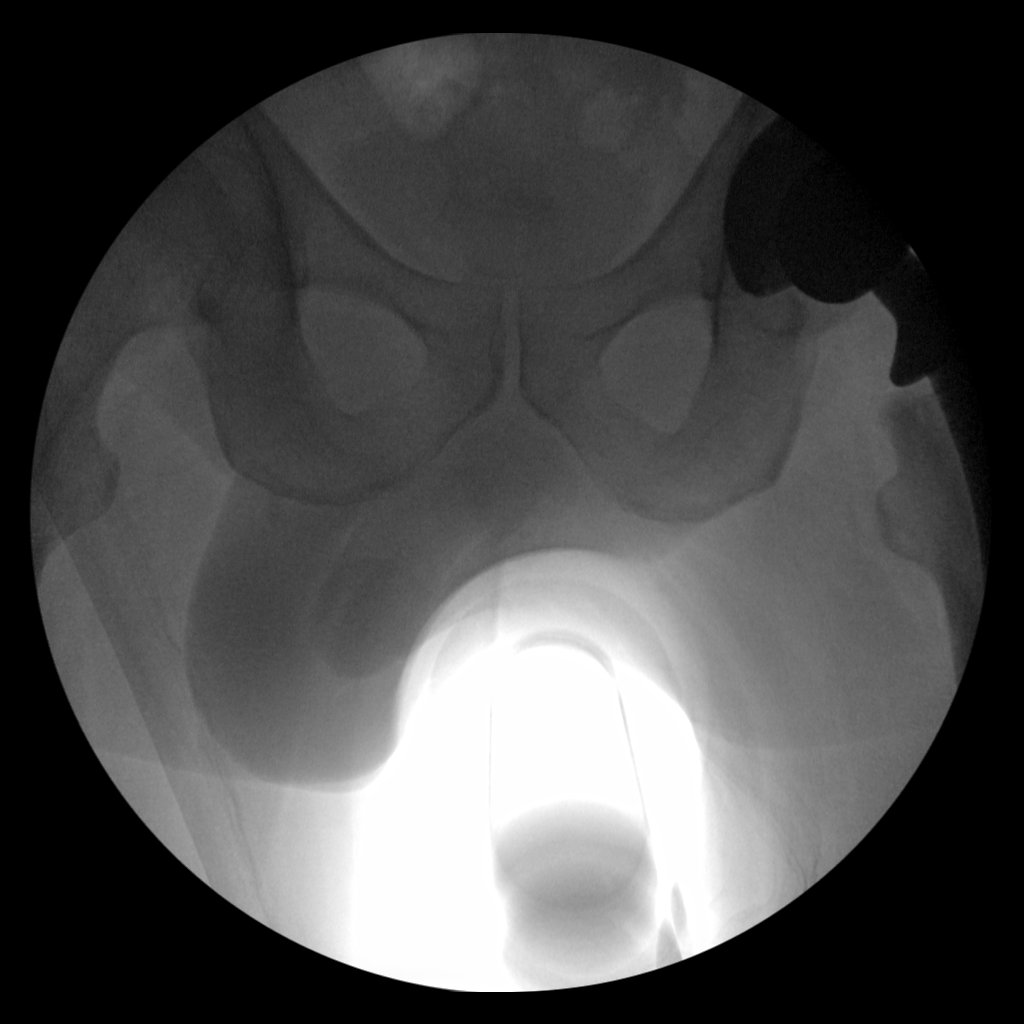

[2 of 2 positions shown; findings below may reference images not displayed]

FINDINGS: Total left hip replacement. Hardware intact. Anatomic alignment. No
acute bony abnormality.
IMPRESSION: Total left hip replacement with anatomic alignment.

## 2019-11-25 IMAGING — DX DG PORTABLE PELVIS
1 series · 1 of 1 positions shown · non-contrast
Comparison: Intraoperative fluoroscopic images earlier today. Hip
radiographs 01/31/2017.

CLINICAL DATA: Status post left hip replacement.

EXAM:
PORTABLE PELVIS 1-2 VIEWS

[pelvis]
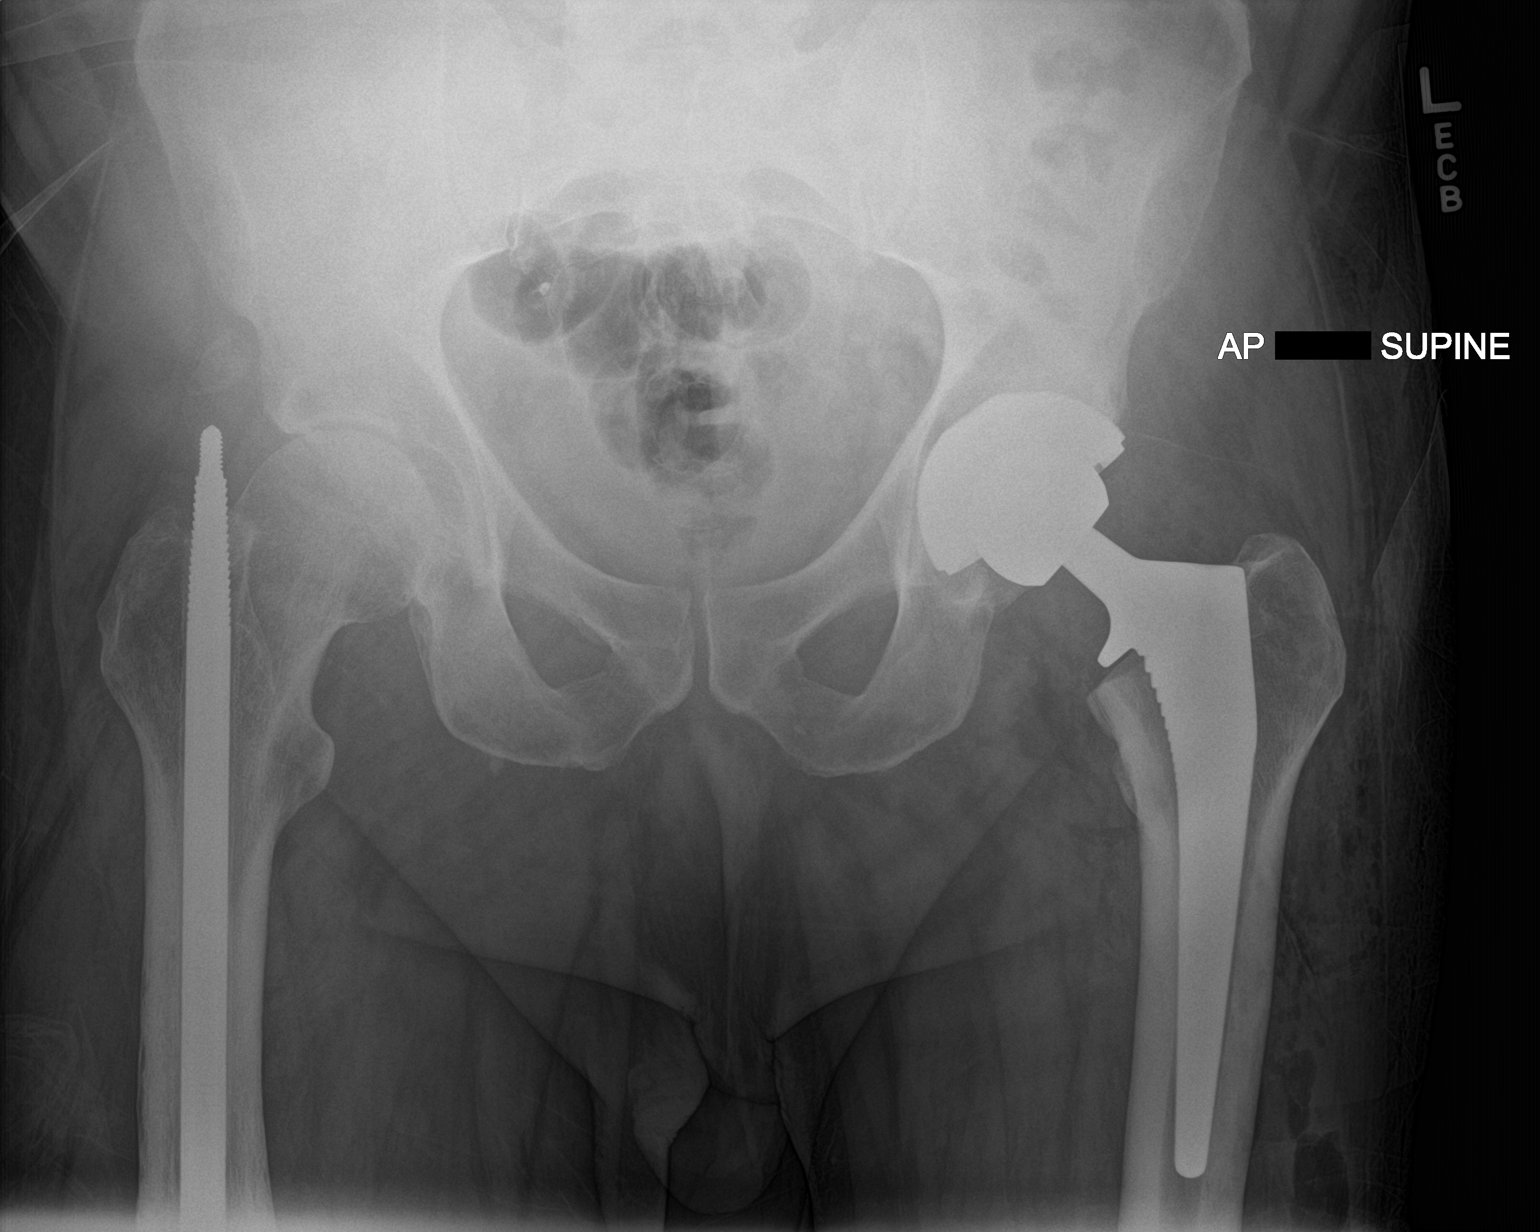

[1 of 1 positions shown; findings below may reference images not displayed]

FINDINGS: Sequelae of left total hip arthroplasty are identified. The
prosthetic femoral and acetabular components are normally located on
this single AP image. Postoperative gas is noted in the adjacent
soft tissues. No acute periprosthetic fracture is identified. An
intramedullary nail is partially visualized in the right femur.
IMPRESSION: Left total hip arthroplasty without evidence of acute complication.

## 2021-04-04 ENCOUNTER — Other Ambulatory Visit: Payer: Self-pay

## 2021-04-04 ENCOUNTER — Ambulatory Visit (INDEPENDENT_AMBULATORY_CARE_PROVIDER_SITE_OTHER): Payer: Medicare Other

## 2021-04-04 ENCOUNTER — Ambulatory Visit (INDEPENDENT_AMBULATORY_CARE_PROVIDER_SITE_OTHER): Payer: Medicare Other | Admitting: Orthopaedic Surgery

## 2021-04-04 DIAGNOSIS — M25561 Pain in right knee: Secondary | ICD-10-CM | POA: Diagnosis not present

## 2021-04-04 DIAGNOSIS — M1711 Unilateral primary osteoarthritis, right knee: Secondary | ICD-10-CM

## 2021-04-04 DIAGNOSIS — G8929 Other chronic pain: Secondary | ICD-10-CM

## 2021-04-04 MED ORDER — METHYLPREDNISOLONE ACETATE 40 MG/ML IJ SUSP
40.0000 mg | INTRAMUSCULAR | Status: AC | PRN
  Administered 2021-04-04: 40 mg via INTRA_ARTICULAR

## 2021-04-04 MED ORDER — LIDOCAINE HCL 1 % IJ SOLN
3.0000 mL | INTRAMUSCULAR | Status: AC | PRN
  Administered 2021-04-04: 3 mL

## 2021-04-04 MED ORDER — GABAPENTIN 300 MG PO CAPS: 300.0000 mg | ORAL_CAPSULE | Freq: Three times a day (TID) | ORAL | 3 refills | Status: AC | PRN

## 2021-04-04 NOTE — Progress Notes (Signed)
Office Visit Note   Patient: Jay Watson           Date of Birth: [birth year]           MRN: [id number] Visit Date: 04/04/2021              Requested by: Javier Glazier, MD [address] [place],  [place] [address] PCP: Javier Glazier, MD   Assessment & Plan: Visit Diagnoses:  1. Chronic pain of right knee   2. Unilateral primary osteoarthritis, right knee     Plan: I agree with conservative treatment at this point for his right knee.  He has not had any type of steroid injections are recommended this as well as low impact activities and continue quad strengthening exercises as well as continued supplements.  He did tolerate steroid injection well.  I gave him a handout to consider hyaluronic acid at some point.  I would like to reevaluate him in 4 weeks to see how he is done with the steroid injection for his right knee.  I will also send in gabapentin for him.  Follow-Up Instructions: Return in about 4 weeks (around 05/02/2021).   Orders:  Orders Placed This Encounter  Procedures   Large Joint Inj   XR Knee 1-2 Views Right   Meds ordered this encounter  Medications   gabapentin (NEURONTIN) 300 MG capsule    Sig: Take 1 capsule (300 mg total) by mouth 3 (three) times daily as needed.    Dispense:  90 capsule    Refill:  3      Procedures: Large Joint Inj: R knee on 04/04/2021 1:21 PM Indications: diagnostic evaluation and pain Details: 22 G 1.5 in needle, superolateral approach  Arthrogram: No  Medications: 3 mL lidocaine 1 %; 40 mg methylPREDNISolone acetate 40 MG/ML Outcome: tolerated well, no immediate complications Procedure, treatment alternatives, risks and benefits explained, specific risks discussed. Consent was given by the patient. Immediately prior to procedure a time out was called to verify the correct patient, procedure, equipment, support staff and site/side marked as required. Patient was prepped and draped in the usual sterile fashion.       Clinical Data: No additional findings.   Subjective: Chief Complaint  Patient presents with   Right Knee - Pain  The patient is a 71 year old gentleman who is a new patient days since I have not seen him in over 3 years.  We actually replaced his left hip in 2018 and that has done well for him.  He is also lost a lot of weight he is very active.  He is an avid Teacher, English as a foreign language.  He has been dealing with right knee pain with it locking up and swelling.  It does bother him when he plays golf and he has good days and bad days.  He also has peripheral neuropathy and has been taking his wife's gabapentin on occasion.  He is taking 300 mg up to 3 times a day sometimes.  He is requesting a refill of this.  He is back into the gym and is working on quad strength exercises recently.  He wants to try conservative treatment before considering any type of knee replacement.  At age 81 he had an intramedullary rod placed in his right femur due to fracture.  He has been treated for bradycardia and that is being monitored.  He denies any side effects to taking gabapentin.  He has been taking glucosamine as well.  HPI  Review of Systems There  is currently no headache, chest pain, shortness of breath, fever, chills, nausea, vomiting  Objective: Vital Signs: There were no vitals taken for this visit.  Physical Exam There is currently listed no headache, chest pain, shortness of breath, fever, chills, nausea, vomiting.  He is alert and orient x3 and in no acute distress Ortho Exam Examination of his right knee shows just slight valgus malalignment and just a slight effusion with good range of motion.  There is more lateral than medial joint line tenderness and some patellofemoral crepitation. Specialty Comments:  No specialty comments available.  Imaging: XR Knee 1-2 Views Right  Result Date: 04/04/2021 2 views of the right knee show significant arthritis in the lateral and patellofemoral joint.  There is  osteophytes and joint space narrowing in those compartments.  There is slight valgus malalignment as well.  There is retained intramedullary rod in the femur.    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  Past Surgical History:  Procedure Laterality Date   FRACTURE SURGERY     titanuim rod right femur  1974   HERNIA REPAIR     rih with mesh  1982   TOTAL HIP ARTHROPLASTY Left 03/13/2017   Procedure: LEFT TOTAL HIP ARTHROPLASTY ANTERIOR APPROACH;  Surgeon: Kathryne Hitch, MD;  Location: [facility] OR;  Service: Orthopedics;  Laterality: Left;   Social History   Occupational History   Not on file  Tobacco Use   Smoking status: Former    Packs/day: 0.25    Years: 3.00    Pack years: 0.75    Types: Cigarettes    Quit date: 03/01/1969    Years since quitting: 52.1   Smokeless tobacco: Never  Vaping Use   Vaping Use: Never used  Substance and Sexual Activity   Alcohol use: Yes    Alcohol/week: 9.0 standard drinks    Types: 6 Glasses of wine, 3 Cans of beer per week   Drug use: No    Comment: smoked pot 30 yrs ago -- none now   Sexual activity: Not on file

## 2021-05-02 ENCOUNTER — Ambulatory Visit: Payer: Medicare Other | Admitting: Orthopaedic Surgery
